# Patient Record
Sex: Male | Born: 1965 | Race: White | Hispanic: No | Marital: Married | Smoking: Never smoker
Health system: Southern US, Community
[De-identification: ages and names within clinical notes are randomized; demographics above are authoritative.]

## PROBLEM LIST (undated history)

## (undated) DIAGNOSIS — A4902 Methicillin resistant Staphylococcus aureus infection, unspecified site: Secondary | ICD-10-CM

## (undated) DIAGNOSIS — M549 Dorsalgia, unspecified: Secondary | ICD-10-CM

## (undated) DIAGNOSIS — I10 Essential (primary) hypertension: Secondary | ICD-10-CM

## (undated) DIAGNOSIS — G8929 Other chronic pain: Secondary | ICD-10-CM

## (undated) DIAGNOSIS — E785 Hyperlipidemia, unspecified: Secondary | ICD-10-CM

## (undated) HISTORY — DX: Essential (primary) hypertension: I10

## (undated) HISTORY — DX: Hyperlipidemia, unspecified: E78.5

---

## 1987-07-26 HISTORY — PX: EYE SURGERY: SHX253

## 2009-09-03 ENCOUNTER — Observation Stay (HOSPITAL_COMMUNITY): Admission: EM | Admit: 2009-09-03 | Discharge: 2009-09-04 | Payer: Self-pay | Admitting: Emergency Medicine

## 2009-09-03 LAB — CONVERTED CEMR LAB
Basophils Absolute: 0 10*3/uL
Basophils Relative: 0 %
Chloride: 101 meq/L
Creatinine, Ser: 1.01 mg/dL
MCHC: 35.6 g/dL
Monocytes Absolute: 0.7 10*3/uL
Neutro Abs: 7.8 10*3/uL
Neutrophils Relative %: 78 %
Potassium: 3.5 meq/L
RDW: 13.4 %

## 2009-09-15 ENCOUNTER — Ambulatory Visit: Payer: Self-pay | Admitting: Nurse Practitioner

## 2009-09-15 DIAGNOSIS — R7309 Other abnormal glucose: Secondary | ICD-10-CM

## 2009-09-15 DIAGNOSIS — I10 Essential (primary) hypertension: Secondary | ICD-10-CM | POA: Insufficient documentation

## 2009-09-15 DIAGNOSIS — A4902 Methicillin resistant Staphylococcus aureus infection, unspecified site: Secondary | ICD-10-CM | POA: Insufficient documentation

## 2009-09-15 DIAGNOSIS — F341 Dysthymic disorder: Secondary | ICD-10-CM

## 2009-09-15 LAB — CONVERTED CEMR LAB
Albumin: 4.9 g/dL (ref 3.5–5.2)
Basophils Relative: 1 % (ref 0–1)
Blood Glucose, Fingerstick: 94
CO2: 27 meq/L (ref 19–32)
Calcium: 9.4 mg/dL (ref 8.4–10.5)
Chloride: 103 meq/L (ref 96–112)
Glucose, Bld: 76 mg/dL (ref 70–99)
Hemoglobin: 15.1 g/dL (ref 13.0–17.0)
Lymphs Abs: 1.4 10*3/uL (ref 0.7–4.0)
MCHC: 34.7 g/dL (ref 30.0–36.0)
Monocytes Absolute: 0.7 10*3/uL (ref 0.1–1.0)
Monocytes Relative: 12 % (ref 3–12)
Neutro Abs: 3.4 10*3/uL (ref 1.7–7.7)
Potassium: 4.2 meq/L (ref 3.5–5.3)
RBC: 5.14 M/uL (ref 4.22–5.81)
Sodium: 141 meq/L (ref 135–145)
TSH: 1.355 microintl units/mL (ref 0.350–4.500)
Total Bilirubin: 0.6 mg/dL (ref 0.3–1.2)
Total Protein: 7.8 g/dL (ref 6.0–8.3)

## 2009-09-16 ENCOUNTER — Encounter (INDEPENDENT_AMBULATORY_CARE_PROVIDER_SITE_OTHER): Payer: Self-pay | Admitting: Nurse Practitioner

## 2009-09-22 DIAGNOSIS — S62609A Fracture of unspecified phalanx of unspecified finger, initial encounter for closed fracture: Secondary | ICD-10-CM

## 2009-10-12 ENCOUNTER — Telehealth (INDEPENDENT_AMBULATORY_CARE_PROVIDER_SITE_OTHER): Payer: Self-pay | Admitting: Nurse Practitioner

## 2009-11-09 ENCOUNTER — Telehealth (INDEPENDENT_AMBULATORY_CARE_PROVIDER_SITE_OTHER): Payer: Self-pay | Admitting: Nurse Practitioner

## 2009-11-11 ENCOUNTER — Telehealth (INDEPENDENT_AMBULATORY_CARE_PROVIDER_SITE_OTHER): Payer: Self-pay | Admitting: Nurse Practitioner

## 2009-11-27 ENCOUNTER — Ambulatory Visit: Payer: Self-pay | Admitting: Nurse Practitioner

## 2009-11-27 LAB — CONVERTED CEMR LAB
Glucose, Urine, Semiquant: NEGATIVE
Nitrite: NEGATIVE
Specific Gravity, Urine: 1.02
WBC Urine, dipstick: NEGATIVE
pH: 7.5

## 2009-11-30 ENCOUNTER — Encounter (INDEPENDENT_AMBULATORY_CARE_PROVIDER_SITE_OTHER): Payer: Self-pay | Admitting: Nurse Practitioner

## 2009-11-30 LAB — CONVERTED CEMR LAB
Cholesterol: 294 mg/dL — ABNORMAL HIGH (ref 0–200)
Microalb, Ur: 0.82 mg/dL (ref 0.00–1.89)

## 2009-12-25 ENCOUNTER — Telehealth (INDEPENDENT_AMBULATORY_CARE_PROVIDER_SITE_OTHER): Payer: Self-pay | Admitting: Nurse Practitioner

## 2009-12-29 ENCOUNTER — Ambulatory Visit: Payer: Self-pay | Admitting: Nurse Practitioner

## 2009-12-29 DIAGNOSIS — IMO0002 Reserved for concepts with insufficient information to code with codable children: Secondary | ICD-10-CM | POA: Insufficient documentation

## 2010-01-26 ENCOUNTER — Ambulatory Visit: Payer: Self-pay | Admitting: Nurse Practitioner

## 2010-01-27 ENCOUNTER — Encounter (INDEPENDENT_AMBULATORY_CARE_PROVIDER_SITE_OTHER): Payer: Self-pay | Admitting: Nurse Practitioner

## 2010-01-27 LAB — CONVERTED CEMR LAB
ALT: 26 units/L (ref 0–53)
AST: 22 units/L (ref 0–37)
Cholesterol: 280 mg/dL — ABNORMAL HIGH (ref 0–200)
Total CHOL/HDL Ratio: 5.4
Total Protein: 7.7 g/dL (ref 6.0–8.3)
Triglycerides: 420 mg/dL — ABNORMAL HIGH (ref <150)

## 2010-02-24 ENCOUNTER — Telehealth (INDEPENDENT_AMBULATORY_CARE_PROVIDER_SITE_OTHER): Payer: Self-pay | Admitting: Nurse Practitioner

## 2010-02-26 ENCOUNTER — Telehealth (INDEPENDENT_AMBULATORY_CARE_PROVIDER_SITE_OTHER): Payer: Self-pay | Admitting: Nurse Practitioner

## 2010-03-25 ENCOUNTER — Ambulatory Visit: Payer: Self-pay | Admitting: Nurse Practitioner

## 2010-03-25 ENCOUNTER — Telehealth (INDEPENDENT_AMBULATORY_CARE_PROVIDER_SITE_OTHER): Payer: Self-pay | Admitting: Nurse Practitioner

## 2010-03-26 ENCOUNTER — Encounter (INDEPENDENT_AMBULATORY_CARE_PROVIDER_SITE_OTHER): Payer: Self-pay | Admitting: Nurse Practitioner

## 2010-03-26 LAB — CONVERTED CEMR LAB
Cholesterol: 241 mg/dL — ABNORMAL HIGH (ref 0–200)
Triglycerides: 421 mg/dL — ABNORMAL HIGH (ref <150)

## 2010-04-23 ENCOUNTER — Telehealth (INDEPENDENT_AMBULATORY_CARE_PROVIDER_SITE_OTHER): Payer: Self-pay | Admitting: Nurse Practitioner

## 2010-05-12 ENCOUNTER — Telehealth (INDEPENDENT_AMBULATORY_CARE_PROVIDER_SITE_OTHER): Payer: Self-pay | Admitting: Nurse Practitioner

## 2010-05-12 ENCOUNTER — Ambulatory Visit: Payer: Self-pay | Admitting: Nurse Practitioner

## 2010-05-13 ENCOUNTER — Telehealth (INDEPENDENT_AMBULATORY_CARE_PROVIDER_SITE_OTHER): Payer: Self-pay | Admitting: *Deleted

## 2010-06-15 ENCOUNTER — Ambulatory Visit: Payer: Self-pay | Admitting: Nurse Practitioner

## 2010-06-15 DIAGNOSIS — E669 Obesity, unspecified: Secondary | ICD-10-CM

## 2010-06-15 DIAGNOSIS — E7800 Pure hypercholesterolemia, unspecified: Secondary | ICD-10-CM

## 2010-06-15 LAB — CONVERTED CEMR LAB
HDL goal, serum: 40 mg/dL
LDL Goal: 130 mg/dL

## 2010-06-16 ENCOUNTER — Encounter (INDEPENDENT_AMBULATORY_CARE_PROVIDER_SITE_OTHER): Payer: Self-pay | Admitting: Nurse Practitioner

## 2010-06-16 LAB — CONVERTED CEMR LAB
ALT: 21 units/L (ref 0–53)
AST: 21 units/L (ref 0–37)
CO2: 24 meq/L (ref 19–32)
Cholesterol: 259 mg/dL — ABNORMAL HIGH (ref 0–200)
Creatinine, Ser: 0.91 mg/dL (ref 0.40–1.50)
Sodium: 139 meq/L (ref 135–145)
Total Bilirubin: 0.5 mg/dL (ref 0.3–1.2)
Total CHOL/HDL Ratio: 5.9
Total Protein: 7.6 g/dL (ref 6.0–8.3)

## 2010-06-22 ENCOUNTER — Encounter (INDEPENDENT_AMBULATORY_CARE_PROVIDER_SITE_OTHER): Payer: Self-pay | Admitting: Nurse Practitioner

## 2010-06-24 ENCOUNTER — Telehealth (INDEPENDENT_AMBULATORY_CARE_PROVIDER_SITE_OTHER): Payer: Self-pay | Admitting: Nurse Practitioner

## 2010-06-24 ENCOUNTER — Ambulatory Visit (HOSPITAL_COMMUNITY)
Admission: RE | Admit: 2010-06-24 | Discharge: 2010-06-24 | Payer: Self-pay | Source: Home / Self Care | Admitting: Internal Medicine

## 2010-06-24 DIAGNOSIS — M549 Dorsalgia, unspecified: Secondary | ICD-10-CM | POA: Insufficient documentation

## 2010-07-05 ENCOUNTER — Encounter (INDEPENDENT_AMBULATORY_CARE_PROVIDER_SITE_OTHER): Payer: Self-pay | Admitting: *Deleted

## 2010-07-07 ENCOUNTER — Encounter
Admission: RE | Admit: 2010-07-07 | Discharge: 2010-07-07 | Payer: Self-pay | Source: Home / Self Care | Attending: Internal Medicine | Admitting: Internal Medicine

## 2010-07-07 ENCOUNTER — Telehealth (INDEPENDENT_AMBULATORY_CARE_PROVIDER_SITE_OTHER): Payer: Self-pay | Admitting: Nurse Practitioner

## 2010-07-09 ENCOUNTER — Encounter (INDEPENDENT_AMBULATORY_CARE_PROVIDER_SITE_OTHER): Payer: Self-pay | Admitting: Nurse Practitioner

## 2010-07-20 ENCOUNTER — Encounter
Admission: RE | Admit: 2010-07-20 | Discharge: 2010-07-20 | Payer: Self-pay | Source: Home / Self Care | Attending: Internal Medicine | Admitting: Internal Medicine

## 2010-07-29 ENCOUNTER — Telehealth (INDEPENDENT_AMBULATORY_CARE_PROVIDER_SITE_OTHER): Payer: Self-pay | Admitting: *Deleted

## 2010-08-02 ENCOUNTER — Encounter (INDEPENDENT_AMBULATORY_CARE_PROVIDER_SITE_OTHER): Payer: Self-pay | Admitting: Nurse Practitioner

## 2010-08-06 ENCOUNTER — Encounter (INDEPENDENT_AMBULATORY_CARE_PROVIDER_SITE_OTHER): Payer: Self-pay | Admitting: Nurse Practitioner

## 2010-08-10 ENCOUNTER — Telehealth (INDEPENDENT_AMBULATORY_CARE_PROVIDER_SITE_OTHER): Payer: Self-pay | Admitting: Nurse Practitioner

## 2010-08-17 ENCOUNTER — Encounter (INDEPENDENT_AMBULATORY_CARE_PROVIDER_SITE_OTHER): Payer: Self-pay | Admitting: Nurse Practitioner

## 2010-08-24 ENCOUNTER — Telehealth (INDEPENDENT_AMBULATORY_CARE_PROVIDER_SITE_OTHER): Payer: Self-pay | Admitting: Nurse Practitioner

## 2010-08-26 NOTE — Letter (Signed)
Summary: PT INFORMATION SHEET  PT INFORMATION SHEET   Imported By: [name] 11/04/2009 12:07:08  _____________________________________________________________________  External Attachment:    Type:   Image     Comment:   External Document

## 2010-08-26 NOTE — Progress Notes (Signed)
Summary: MRI results  Phone Note Outgoing Call   Summary of Call: notify pt that his MRI does show that he has a great deal of arthritis along with some irritation in the nerves in the lower part of his back will refer him to neurosurgery for further evaluation received refill request from tramadol. he got 120 on 06/15/2010 - will not refill he should be alternating tramadol with diclofenac (rx given 03/2010 with 3 refills) Will also start gabapentin 300mg  by mouth night x 3 nights then increase to 2 capsules by mouth nightly for pain (he should consider getting this from [facility] or [place] pharmacy as it will be expensive at [facility]) Can see if he can be seen by interventional radiology in the meantime for a steroid injection in his back which may offer some relief.  If pt wants this referral as well let me know and i can order it Initial call taken by: Lehman Prom [name],  June 24, 2010 6:47 PM  Follow-up for Phone Call        left message on machine for pt to return call to the office. Shawn Pittman  June 25, 2010 4:23 PM  pt informed of above information and will try the gabapentin to see if it will help.  he is still debating about the injection in his back at this time.  Follow-up by: Shawn Pittman,  June 25, 2010 4:35 PM  Additional Follow-up for Phone Call Additional follow up Details #1::        MR Cowing CALLED THIS MORNING AND SAYS THAT HE WANTS [place] TO GO AHEAD AND MAKE HIS REFERRAL TO SEE SOMEONE TO GET THE SHOTS IN HIS BACK Additional Follow-up by: Leodis Rains,  June 28, 2010 1:02 PM  New Problems: BACK PAIN (ICD-724.5)   Additional Follow-up for Phone Call Additional follow up Details #2::    order for interventional radiology done will have Arna Medici schedule  also will have pt referred to neurosurgery for additional evaluation Follow-up by: Lehman Prom [name],  June 28, 2010 3:19 PM  Additional Follow-up for Phone Call Additional follow up Details  #3:: Details for Additional Follow-up Action Taken: I SEND THE REFERRAL ON 06-28-10 WAITING FOR AN APPT .Marland KitchenCheryll Dessert  June 30, 2010 5:04 PM   New Problems: BACK PAIN (ICD-724.5) New/Updated Medications: GABAPENTIN 300 MG CAPS (GABAPENTIN) One capsule by mouth nightly x 3 nights then 2 capsules by mouth nightly Prescriptions: GABAPENTIN 300 MG CAPS (GABAPENTIN) One capsule by mouth nightly x 3 nights then 2 capsules by mouth nightly  #60 x 1   Entered and Authorized by:   Lehman Prom [name]   Signed by:   Lehman Prom [name] on 06/24/2010   Method used:   Printed then faxed to ...       [facility]ret[facility]       [address]       [place], [place]  [address]       Ph: [phone]       Fax: [phone]   RxID:   [id number]

## 2010-08-26 NOTE — Assessment & Plan Note (Signed)
Summary: Back pain/HTN   Vital Signs:  Patient profile:   45 year old male Height:      69.50 inches Weight:      265 pounds BMI:     38.71 Temp:     97.0 degrees F oral Pulse rate:   72 / minute Pulse rhythm:   regular Resp:     22 per minute BP sitting:   130 / 84  (left arm)  Vitals Entered By: [place] Shannon (June 15, 2010 8:17 AM)  Nutrition Counseling: Patient's BMI is greater than 25 and therefore counseled on weight management options. CC: follow-up visit, Hypertension Management, Lipid Management, Depression, Back Pain Is Patient Diabetic? No Pain Assessment Patient in pain? yes     Location: lower back Intensity: 9 Type: thrombing Onset of pain  Constant  Does patient need assistance? Functional Status Self care Ambulation Normal   Primary Care Provider:  Lehman Prom [name]  CC:  follow-up visit, Hypertension Management, Lipid Management, Depression, and Back Pain.  History of Present Illness:  Pt into the office for routined f/u also with back pain. "My back went out" Pt reports that he was cutting wood on yesterday and injured his back He has a long history of back pain.  He has been taking tramadol as needed for pain but he reports that he has to take 2 tablets by mouth two times a day for pain.  Left shoulder - Nothing acute per x-ray (pt presents with film from [facility] done on last night) Lumbar spine - degenerative disc, arthritis  Chronic pain - multiple fractures to all digits. Aches and cramps in all finger. Previously worked in [employer]  Girlfriend present with pt today.   Back Pain History:      The patient's back pain has been present for > 6 weeks.  The pain is located in the lower back region and does not radiate below the knees.  He states this is not work related.  He states that he has had a prior history of back pain.  The patient has not had any recent physical therapy for his back pain.    Critical Exclusionary  Diagnosis Criteria (CEDC) for Back Pain:      There are no symptoms to suggest infection, cancer, cauda equina, or psychosocial factors for back pain.  Other positive CEDC factors include low back pain worse with activity.    Depression History:      The patient notes a diminished interest in his usual daily activities.  The patient denies recurrent thoughts of death or suicide.        Psychosocial stress factors include the recent death of a loved one.  The patient denies that he feels like life is not worth living, denies that he wishes that he were dead, and denies that he has thought about ending his life.         Depression Treatment History:  Prior Medication Used:   Start Date: Assessment of Effect:   Comments:  Zoloft (sertraline)     --     some improvement     taking medications as ordered  Hypertension History:      He denies headache, chest pain, and palpitations.  He notes no problems with any antihypertensive medication side effects.        Positive major cardiovascular risk factors include male age 55 years old or older, hyperlipidemia, and hypertension.  Negative major cardiovascular risk factors include no history of diabetes and non-tobacco-user  status.        Further assessment for target organ damage reveals no history of stroke/TIA, peripheral vascular disease, or renal insufficiency.    Lipid Management History:      Positive NCEP/ATP III risk factors include male age 41 years old or older and hypertension.  Negative NCEP/ATP III risk factors include non-diabetic, non-tobacco-user status, no prior stroke/TIA, no peripheral vascular disease, and no history of aortic aneurysm.        The patient states that he does not know about the "Therapeutic Lifestyle Change" diet.  The patient does not know about adjunctive measures for cholesterol lowering.  Comments include: pt is fasting today for labs.        Habits & Providers  Alcohol-Tobacco-Diet     Alcohol drinks/day:  <1     Alcohol type: beer     Tobacco Status: never  Exercise-Depression-Behavior     Does Patient Exercise: no     Depression Counseling: not indicated; screening negative for depression     Drug Use: past  Current Medications (verified): 1)  Sertraline Hcl 100 Mg Tabs (Sertraline Hcl) .... One Tablet By Mouth Daily For Mood 2)  Lisinopril 20 Mg Tabs (Lisinopril) .... One Tablet By Mouth Daily For Blood Pressure 3)  Hibiclens 4 % Liqd (Chlorhexidine Gluconate) .... Apply To Body and Let Lather. Let Sit For 5 Minutes Then Rinse At Least Three Times Per Week 4)  Diclofenac Sodium 75 Mg Tbec (Diclofenac Sodium) .... One Tablet By Mouth Twice Daily For Joints 5)  Ultram 50 Mg Tabs (Tramadol Hcl) .... Two Tablets By Mouth Two Times A Day As Needed For Pain 6)  Pravastatin Sodium 20 Mg Tabs (Pravastatin Sodium) .... Two Tablets By Mouth Nightly For Cholesterol 7)  Lovaza 1 Gm Caps (Omega-3-Acid Ethyl Esters) .... 2 Capsules By Mouth Two Times A Day For Cholesterol 8)  Clonazepam 1 Mg Tabs (Clonazepam) .... One Tablet By Mouth Daily As Needed For Anxiety  Allergies (verified): No Known Drug Allergies  Review of Systems General:  Denies fever. CV:  Denies chest pain or discomfort. Resp:  Denies cough. GI:  Denies abdominal pain, nausea, and vomiting. MS:  Complains of joint pain; low back pain and aches in all joints.  Physical Exam  General:  alert.   Head:  normocephalic.   Eyes:  right eye enucleation Lungs:  normal breath sounds.   Heart:  normal rate and regular rhythm.   Abdomen:  normal bowel sounds.   Neurologic:  alert & oriented X3.     Impression & Recommendations:  Problem # 1:  HYPERTENSION, BENIGN ESSENTIAL (ICD-401.1) BP is doing well continue current medications His updated medication list for this problem includes:    Lisinopril 20 Mg Tabs (Lisinopril) ..... One tablet by mouth daily for blood pressure  Problem # 2:  HYPERCHOLESTEROLEMIA (ICD-272.0) will  check labs today His updated medication list for this problem includes:    Pravastatin Sodium 20 Mg Tabs (Pravastatin sodium) .Marland Kitchen..Marland Kitchen Two tablets by mouth nightly for cholesterol    Lovaza 1 Gm Caps (Omega-3-acid ethyl esters) .Marland Kitchen... 2 capsules by mouth two times a day for cholesterol  Orders: T-Comprehensive Metabolic Panel [id number]) T-Lipid Profile ([id number])  Problem # 3:  BACK PAIN (ICD-724.5) ongoing problem with pt  will add muscle relaxer His updated medication list for this problem includes:    Diclofenac Sodium 75 Mg Tbec (Diclofenac sodium) ..... One tablet by mouth twice daily for joints    Ultram 50 Mg Tabs (  Tramadol hcl) .Marland Kitchen..Marland Kitchen Two tablets by mouth two times a day as needed for pain    Cyclobenzaprine Hcl 10 Mg Tabs (Cyclobenzaprine hcl) ..... One tablet by mouth nightly as needed for muscles  Problem # 4:  ANXIETY DEPRESSION (ICD-300.4) will increase zoloft to 200mg  by mouth daily  Problem # 5:  OBESITY (ICD-278.00) advised pt to increase ambulation  Complete Medication List: 1)  Sertraline Hcl 100 Mg Tabs (Sertraline hcl) .... Two  tablet by mouth daily for mood **note dose increase** 2)  Lisinopril 20 Mg Tabs (Lisinopril) .... One tablet by mouth daily for blood pressure 3)  Hibiclens 4 % Liqd (Chlorhexidine gluconate) .... Apply to body and let lather. let sit for 5 minutes then rinse at least three times per week 4)  Diclofenac Sodium 75 Mg Tbec (Diclofenac sodium) .... One tablet by mouth twice daily for joints 5)  Ultram 50 Mg Tabs (Tramadol hcl) .... Two tablets by mouth two times a day as needed for pain 6)  Pravastatin Sodium 20 Mg Tabs (Pravastatin sodium) .... Two tablets by mouth nightly for cholesterol 7)  Lovaza 1 Gm Caps (Omega-3-acid ethyl esters) .... 2 capsules by mouth two times a day for cholesterol 8)  Clonazepam 1 Mg Tabs (Clonazepam) .... One tablet by mouth daily as needed for anxiety 9)  Cyclobenzaprine Hcl 10 Mg Tabs (Cyclobenzaprine hcl)  .... One tablet by mouth nightly as needed for muscles  Hypertension Assessment/Plan:      The patient's hypertensive risk group is category B: At least one risk factor (excluding diabetes) with no target organ damage.  Today's blood pressure is 130/84.  His blood pressure goal is < 140/90.  Lipid Assessment/Plan:      Based on NCEP/ATP III, the patient's risk factor category is "2 or more risk factors and a calculated 10 year CAD risk of > 20%".  The patient's lipid goals are as follows: Total cholesterol goal is 200; LDL cholesterol goal is 130; HDL cholesterol goal is 40; Triglyceride goal is 150.    Patient Instructions: 1)  Sign a release to get records from [facility] - x-ray results. 2)  Your cholesterol will be checked today. You will be notified of the results. 3)  Back pain - Your tramadol will be increased to 120 per month.  Be sure to stick to this amount.  Will add flexeril (muscle relaxer) as needed  4)  Mood swings - will increase sertraline to 100mg  - 2 tablets by mouth daily.  Keep your Klonopin at 30 per month.   5)  Follow up in 3 months for blood pressure and mood. Prescriptions: SERTRALINE HCL 100 MG TABS (SERTRALINE HCL) Two  tablet by mouth daily for mood **note dose increase**  #60 x 5   Entered and Authorized by:   Lehman Prom [name]   Signed by:   Lehman Prom [name] on 06/15/2010   Method used:   Print then Give to Patient   RxID:   [id number] CYCLOBENZAPRINE HCL 10 MG TABS (CYCLOBENZAPRINE HCL) One tablet by mouth nightly as needed for muscles  #30 x 0   Entered and Authorized by:   Lehman Prom [name]   Signed by:   Lehman Prom [name] on 06/15/2010   Method used:   Print then Give to Patient   RxID:   [id number] ULTRAM 50 MG TABS (TRAMADOL HCL) Two tablets by mouth two times a day as needed for pain  #120 x 0   Entered and Authorized by:  Lehman Prom [name]   Signed by:   Lehman Prom [name] on 06/15/2010   Method used:   Print then Give  to Patient   RxID:   [id number]    Orders Added: 1)  Est. Patient Level III [[address]] 2)  T-Comprehensive Metabolic Panel [[id number]] 3)  T-Lipid Profile [[id number]]

## 2010-08-26 NOTE — Assessment & Plan Note (Signed)
Summary: HTN   Vital Signs:  Patient profile:   45 year old male Weight:      253 pounds Temp:     98.2 degrees F Pulse rate:   75 / minute Pulse rhythm:   regular Resp:     20 per minute BP sitting:   148 / 100 Cuff size:   large  Vitals Entered By: Chauncy Passy, SMA CC: Shawn Pittman. is here for a f/u from feb. on is BP and review meds. , Hypertension Management, Depression, Back Pain Is Patient Diabetic? No Pain Assessment Patient in pain? yes     Location: hands Intensity: 8 Type: aching Onset of pain  Constant  Does patient need assistance? Functional Status Self care Ambulation Normal   CC:  Shawn Pittman. is here for a f/u from feb. on is BP and review meds. , Hypertension Management, Depression, and Back Pain.  History of Present Illness:  Shawn Pittman into the office for f/u. He has been seen once to establish care in February 2011.  Shawn Pittman presents today with his medication [facility].  Pain - mostly in his hands and also in his back. He has started the arthrotec but reports that he did not get any during March. He was just able to get a supply in April but has not seen any benefits. Tramadol - finds effective but he does admit that he takes 2 twice a day. 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 "I don't want to get on any narcotic"  Back Pain History:      The patient's back pain has been present for > 6 weeks.  The pain is located in the lower back region and does not radiate below the knees.    Depression History:      The patient denies insomnia, fatigue (loss of energy), and recurrent thoughts of death or suicide.        The patient denies that he feels like life is not worth living, denies that he wishes that he were dead, and denies that he has thought about ending his life.         Depression Treatment History:  Prior Medication Used:   Start Date: Assessment of Effect:   Comments:  Zoloft  (sertraline)     --     some improvement     will increase the dose  Hypertension History:      Positive major cardiovascular risk factors include male age 28 years old or older and hypertension.  Negative major cardiovascular risk factors include no history of diabetes and non-tobacco-user status.        Further assessment for target organ damage reveals no history of stroke/TIA or renal insufficiency.       Habits & Providers  Alcohol-Tobacco-Diet     Alcohol drinks/day: <1     Alcohol type: beer     Tobacco Status: never  Exercise-Depression-Behavior     Does Patient Exercise: no     Have you felt down or hopeless? yes     Have you felt little pleasure in things? yes     Depression Counseling: not indicated; screening negative for depression     Drug Use: past  Comments: PHQ-9 score = 3 (Shawn Pittman has already started meds)  Current Medications (verified): 1)  Sertraline Hcl 50 Mg Tabs (Sertraline Hcl) .... One Tablet By Mouth Nightly For Mood 2)  Lisinopril 10 Mg Tabs (  Lisinopril) .... One Tablet By Mouth For Blood Pressure 3)  Betasept Surgical Scrub 4 % Liqd (Chlorhexidine Gluconate) .... Use To Affected Area Twice Weekly To Body 4)  Arthrotec 75 75-200 Mg-Mcg Tabs (Diclofenac-Misoprostol) .... One Tablet By Mouth Two Times A Day For Joints **take With Food** 5)  Ultram 50 Mg Tabs (Tramadol Hcl) .... Two Tablets By Mouth Daily As Needed For Pain  Allergies (verified): No Known Drug Allergies  Review of Systems CV:  Denies chest pain or discomfort. Resp:  Denies cough. GI:  Denies abdominal pain. MS:  Complains of joint pain and low back pain. Psych:  Complains of depression; improving with zoloft.  Physical Exam  General:  alert.  obese Head:  normocephalic.   Eyes:  right eye - enucleated Lungs:  normal breath sounds.   Heart:  normal rate and regular rhythm.   Neurologic:  alert & oriented X3.   Skin:  color normal.   Psych:  Oriented X3.     Impression &  Recommendations:  Problem # 1:  HYPERTENSION, BENIGN ESSENTIAL (ICD-401.1) BP is still elevated will increase lisinopril to 20mg  by mouth daily reinforced DASH diet His updated medication list for this problem includes:    Lisinopril 20 Mg Tabs (Lisinopril) ..... One tablet by mouth daily for blood pressure  Orders: T-Urine Microalbumin w/creat. ratio [phone]) T-Lipid Profile ([id number]) UA Dipstick w/o Micro (manual) (44010)  Problem # 2:  ANXIETY DEPRESSION (ICD-300.4) Shawn Pittman is doing well on zoloft but will increase to 100mg  for added benefits  Problem # 3:  BACK PAIN (ICD-724.5) Will increase tramadol to up to three per day no NARCS His updated medication list for this problem includes:    Diclofenac Sodium 75 Mg Tbec (Diclofenac sodium) ..... 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 One tablet by mouth daily for mood 2)  Lisinopril 20 Mg Tabs (Lisinopril) .... One tablet by mouth daily for blood pressure 3)  Betasept Surgical Scrub 4 % Liqd (Chlorhexidine gluconate) .... Use to affected area twice weekly to body 4)  Diclofenac Sodium 75 Mg Tbec (Diclofenac sodium) .... One tablet by mouth twice daily for joints 5)  Ultram 50 Mg Tabs (Tramadol hcl) .... Two tablets by mouth two times a day as needed for pain  Hypertension Assessment/Plan:      The patient's hypertensive risk group is category B: At least one risk factor (excluding diabetes) with no target organ damage.  Today's blood pressure is 148/100.  His blood pressure goal is < 140/90.  Patient Instructions: 1)  Blood pressure - Still a little high. Will increase lisinopril to  20mg  by mouth daily. You can take 2 of the 10mg  tablets that you have then start the new prescription for 20mg   2)  Mood - will increase zoloft to 100mg  by mouth daily 3)  Joints. - ok to take up to 3 tramadol per day. 4)  Take the diclofenac 75mg  by mouth two times a day for joints (with food) 5)  Follow up in 1 month for a blood pressure check - nurse visit 6)  Take blood pressure medications before this visit  7)  Follow up with n.martin,fnp in 4 months (September)  or sooner if necessary.   8)  Have pharmacy send refill requests to this office for any refills that you need before your next visit Prescriptions: SERTRALINE HCL 100 MG TABS (SERTRALINE HCL) One tablet by mouth daily for mood  #30 x 3   Entered and Authorized by:   Lehman Prom [name]   Signed by:   Lehman Prom [name] on 11/27/2009   Method used:   Print then Give to Patient   RxID:   [id number] ULTRAM 50 MG TABS (TRAMADOL HCL) Two tablets by mouth two times a day as needed for pain  #90 x 0   Entered and Authorized by:   Lehman Prom [name]   Signed by:   Lehman Prom [name] on 11/27/2009   Method used:   Print then Give to Patient   RxID:   [id number] DICLOFENAC SODIUM 75 MG TBEC (DICLOFENAC SODIUM) One tablet by mouth twice daily for joints  #60 x 1   Entered and Authorized by:   Lehman Prom [name]   Signed by:   Lehman Prom [name] on 11/27/2009   Method used:   Print then Give to Patient   RxID:   [id number] LISINOPRIL 20 MG TABS (LISINOPRIL) One tablet by mouth daily for blood pressure  #30 x 3   Entered and Authorized by:   Lehman Prom [name]   Signed by:   Lehman Prom [name] on 11/27/2009   Method used:   Print then Give to Patient   RxID:   [id number]   Laboratory Results   Urine Tests  Date/Time Received: Nov 27, 2009 8:42 AM   Routine Urinalysis   Color: yellow Glucose: negative   (Normal Range: Negative) Bilirubin: negative   (Normal Range: Negative) Ketone: negative    (Normal Range: Negative) Spec. Gravity: 1.020   (Normal Range: 1.003-1.035) Blood: negative   (Normal Range: Negative) pH: 7.5   (Normal Range: 5.0-8.0) Protein: negative   (Normal Range: Negative) Urobilinogen: 0.2   (Normal Range: 0-1) Nitrite: negative   (Normal Range: Negative) Leukocyte Esterace: negative   (Normal Range: Negative)

## 2010-08-26 NOTE — Letter (Signed)
Summary: REQUESTING RECORDS FROM [facility]  REQUESTING RECORDS FROM [facility]   Imported By: [name] 06/22/2010 15:03:43  _____________________________________________________________________  External Attachment:    Type:   Image     Comment:   External Document

## 2010-08-26 NOTE — Letter (Signed)
Summary: Lipid Letter  [facility]  [address] [place], [place] [address]   Phone: [phone]  Fax: [phone]    03/26/2010  Markez Dowland [address] [place], [place]  [address]  Dear [name]:  We have carefully reviewed your last lipid profile from 03/25/2010 and the results are noted below with a summary of recommendations for lipid management.    Cholesterol:       241     Goal: less than 200   HDL "good" Cholesterol:   47     Goal: greater than 40   LDL "bad" Cholesterol:  Too High to calculate   Triglycerides:       421     Goal: less than 150    Labs done during recent office visit shows that your cholesterol is still very high. You should have been contacted by this office about medication changes.    Current Medications: 1)    Sertraline Hcl 100 Mg Tabs (Sertraline hcl) .... One tablet by mouth daily for mood 2)    Lisinopril 20 Mg Tabs (Lisinopril) .... One tablet by mouth daily for blood pressure 3)    Hibiclens 4 % Liqd (Chlorhexidine gluconate) .... Apply to body and let lather. let sit for 5 minutes then rinse at least three times per week 4)    Diclofenac Sodium 75 Mg Tbec (Diclofenac sodium) .... One tablet by mouth twice daily for joints 5)    Ultram 50 Mg Tabs (Tramadol hcl) .... Two tablets by mouth two times a day as needed for pain 6)    Pravastatin Sodium 20 Mg Tabs (Pravastatin sodium) .... Two tablets by mouth nightly for cholesterol 7)    Lovaza 1 Gm Caps (Omega-3-acid ethyl esters) .... 2 capsules by mouth two times a day for cholesterol  If you have any questions, please call. We appreciate being able to work with you.   Sincerely,    [facility] Lehman Prom [name]

## 2010-08-26 NOTE — Letter (Signed)
Summary: Lipid Letter  [facility]  [address] [place], [place] [address]   Phone: [phone]  Fax: [phone]    11/30/2009  Shawn Pittman [address] [place], [place]  [address]  Dear Shawn Pittman:  We have carefully reviewed your last lipid profile from 11/27/2009 and the results are noted below with a summary of recommendations for lipid management.    Cholesterol:       294     Goal: less than 200   HDL "good" Cholesterol:   38     Goal: greater than 40   LDL "bad" Cholesterol:  Too High to Calculate   Triglycerides:       466     Goal: Less than 150    You should have been notified by this office regarding your cholesterol.  See the above values.  Start medications as ordered and also lifestyle modifications.     Current Medications: 1)    Sertraline Hcl 100 Mg Tabs (Sertraline hcl) .... One tablet by mouth daily for mood 2)    Lisinopril 20 Mg Tabs (Lisinopril) .... One tablet by mouth daily for blood pressure 3)    Betasept Surgical Scrub 4 % Liqd (Chlorhexidine gluconate) .... Use to affected area twice weekly to body 4)    Diclofenac Sodium 75 Mg Tbec (Diclofenac sodium) .... One tablet by mouth twice daily for joints 5)    Ultram 50 Mg Tabs (Tramadol hcl) .... Two tablets by mouth two times a day as needed for pain 6)    Pravastatin Sodium 20 Mg Tabs (Pravastatin sodium) .... One tablet by mouth nightly for cholesterol  If you have any questions, please call. We appreciate being able to work with you.   Sincerely,    Lehman Prom, [name] [facility]

## 2010-08-26 NOTE — Letter (Signed)
Summary: TEST ORDER FORM//MRI//APPT DATE & TIME  TEST ORDER FORM//MRI//APPT DATE & TIME   Imported By: [name] 06/22/2010 15:50:50  _____________________________________________________________________  External Attachment:    Type:   Image     Comment:   External Document

## 2010-08-26 NOTE — Progress Notes (Signed)
Summary: Office Visit//DEPRESSION SCREENING  Office Visit//DEPRESSION SCREENING   Imported By: [name] 01/22/2010 11:47:02  _____________________________________________________________________  External Attachment:    Type:   Image     Comment:   External Document

## 2010-08-26 NOTE — Progress Notes (Signed)
Summary: REFILL ON TRAMADOL  Phone Note Call from Patient Call back at Home Phone [phone]   Reason for Call: Refill Medication Summary of Call: MARTIN PT. MR Ledo IS CALLING IN FOR A REFILL REQUEST FOR HIS TRAMADOL TO BE CALLLED INTO [facility]. Initial call taken by: Leodis Rains,  November 09, 2009 11:55 AM  Follow-up for Phone Call        last filled 10/12/09 #60 forward to N. Daphine Deutscher, [name] Follow-up by: Levon Hedger,  November 09, 2009 12:11 PM  Additional Follow-up for Phone Call Additional follow up Details #1::        notify pt will fill meds on 11/12/2009 and fax to [facility] Meds are for 1 month supply Additional Follow-up by: Lehman Prom [name],  November 09, 2009 12:37 PM    Additional Follow-up for Phone Call Additional follow up Details #2::    pt informed. Follow-up by: Levon Hedger,  November 09, 2009 1:07 PM

## 2010-08-26 NOTE — Letter (Signed)
Summary: Lipid Letter  [facility]  [address] [place], [place] [address]   Phone: [phone]  Fax: [phone]    01/27/2010  Shawn Pittman [address] [place], [place]  [address]  Dear Shawn Pittman:  We have carefully reviewed your last lipid profile from 01/26/2010 and the results are noted below with a summary of recommendations for lipid management.    Cholesterol:       280     Goal: less than 200   HDL "good" Cholesterol:   52     Goal: greater than 40   LDL "bad" Cholesterol:  TOO HIGH TO CALCULATE   Triglycerides:       420     Goal: LESS THAN 150    Your cholesterol is still VERY elevated.  You should have spoken with this office about the need to increase your cholesterol medication.  It is very important that you stop eating fried and fatty foods.  Start walking to help lower your weight.  Cholesterol is very important as this is what causes heart attacks and stroke.     Current Medications: 1)    Sertraline Hcl 100 Mg Tabs (Sertraline hcl) .... One tablet by mouth daily for mood 2)    Lisinopril 20 Mg Tabs (Lisinopril) .... One tablet by mouth daily for blood pressure 3)    Hibiclens 4 % Liqd (Chlorhexidine gluconate) .... Apply to body and let lather. let sit for 5 minutes then rinse at least three times per week 4)    Diclofenac Sodium 75 Mg Tbec (Diclofenac sodium) .... One tablet by mouth twice daily for joints 5)    Ultram 50 Mg Tabs (Tramadol hcl) .... Two tablets by mouth two times a day as needed for pain 6)    Pravastatin Sodium 20 Mg Tabs (Pravastatin sodium) .... Two tablets by mouth nightly for cholesterol  If you have any questions, please call. We appreciate being able to work with you.   Sincerely,    [facility] Lehman Prom [name]

## 2010-08-26 NOTE — Letter (Signed)
Summary: Handout Printed  Printed Handout:  - Diet - Low-Fat, Low-Saturated-Fat, Low-Cholesterol Diets 

## 2010-08-26 NOTE — Letter (Signed)
Summary: Lipid Letter  [facility]  [address] [place], [place] [address]   Phone: [phone]  Fax: [phone]    06/16/2010  Shawn Pittman [address] [place], [place]  [address]  Dear [name]:  We have carefully reviewed your last lipid profile from 06/15/2010 and the results are noted below with a summary of recommendations for lipid management.    Cholesterol:      259    Goal: less than 200   HDL "good" Cholesterol:   44     Goal: greater than 40   LDL "bad" Cholesterol:  TOO HIGH CALCULATE   Triglycerides:      590    Goal: LESS THAN 150    Your cholesterol is still elevated.  You should continue current medications.  However most importantly you need to change your diet.  You need to eat baked instead of fried foods. Also make low fat food selections.  You will cholesterol rechecked in 3 months.     Current Medications: 1)    Sertraline Hcl 100 Mg Tabs (Sertraline hcl) .... Two  tablet by mouth daily for mood **note dose increase** 2)    Lisinopril 20 Mg Tabs (Lisinopril) .... One tablet by mouth daily for blood pressure 3)    Hibiclens 4 % Liqd (Chlorhexidine gluconate) .... Apply to body and let lather. let sit for 5 minutes then rinse at least three times per week 4)    Diclofenac Sodium 75 Mg Tbec (Diclofenac sodium) .... One tablet by mouth twice daily for joints 5)    Ultram 50 Mg Tabs (Tramadol hcl) .... Two tablets by mouth two times a day as needed for pain 6)    Pravastatin Sodium 20 Mg Tabs (Pravastatin sodium) .... Two tablets by mouth nightly for cholesterol 7)    Lovaza 1 Gm Caps (Omega-3-acid ethyl esters) .... 2 capsules by mouth two times a day for cholesterol 8)    Clonazepam 1 Mg Tabs (Clonazepam) .... One tablet by mouth daily as needed for anxiety 9)    Cyclobenzaprine Hcl 10 Mg Tabs (Cyclobenzaprine hcl) .... One tablet by mouth nightly as needed for muscles  If you have any questions, please call. We  appreciate being able to work with you.   Sincerely,    [facility] Lehman Prom [name]

## 2010-08-26 NOTE — Letter (Signed)
Summary: REQUESTED RECORDS FOR SELF  REQUESTED RECORDS FOR SELF   Imported By: [name] 07/09/2010 11:12:46  _____________________________________________________________________  External Attachment:    Type:   Image     Comment:   External Document

## 2010-08-26 NOTE — Letter (Signed)
Summary: Handout Printed  Printed Handout:  - Diet - Low-Cholesterol Guidelines 

## 2010-08-26 NOTE — Letter (Signed)
Summary: *Referral Letter  [facility]  [address] [place], [place] [address]   Phone: [phone]  Fax: [phone]    08/06/2010  Shawn Pittman [address] [place], [place]  [address]  Phone: [phone]  To: Shawn Pittman. Shawn Pittman,  Shawn Pittman is an established patient in this office.  See below his current medical history. Of particular concern is his back pain.  Shawn Pittman has begun the work up process to determine the cause of his low back pain.  He has had both and x-ray and MRI.  He had an interlaminar epidural steroid injection in attempt to provide some pain relief.  He is currently being managed with pain medications and he has been advised to make some lifestyle changes to limit excessive bending and lifting. He is scheduled to see a neurosurgeon on September 28, 2010 for additional work up and treatment plans.  The specialist will determine the course of action for the patient and if he needs surgical intervention.  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 let sit for 5 minutes then rinse at least three times per week 4)  DICLOFENAC SODIUM 75 MG TBEC (DICLOFENAC SODIUM) One tablet by mouth twice daily for joints 5)  ULTRAM  50 MG TABS (TRAMADOL HCL) Two tablets by mouth two times a day as needed for pain 6)  PRAVASTATIN SODIUM 20 MG TABS (PRAVASTATIN SODIUM) Two tablets by mouth nightly for cholesterol 7)  LOVAZA 1 GM CAPS (OMEGA-3-ACID ETHYL ESTERS) 2 capsules by mouth two times a day for cholesterol 8)  CLONAZEPAM 1 MG TABS (CLONAZEPAM) One tablet by mouth daily as needed for anxiety 9)  CYCLOBENZAPRINE HCL 10 MG TABS (CYCLOBENZAPRINE HCL) One tablet by mouth nightly as needed for muscles 10)  GABAPENTIN 300 MG CAPS (GABAPENTIN) One capsule by mouth nightly x 3 nights then 2 capsules by mouth nightly      Please contact us if you have any further questions or need additional information.  Sincerely,  Lehman Prom [name]

## 2010-08-26 NOTE — Progress Notes (Signed)
Summary: medication refill  Phone Note Refill Request Message from:  Patient on February 26, 2010 4:48 PM  Refills Requested: Medication #1:  ULTRAM 50 MG TABS Two tablets by mouth two times a day as needed for pain   Last Refilled: 01/26/2010 Initial call taken by: Levon Hedger,  February 26, 2010 4:49 PM    Prescriptions: ULTRAM 50 MG TABS (TRAMADOL HCL) Two tablets by mouth two times a day as needed for pain  #90 x 0   Entered and Authorized by:   Lehman Prom [name]   Signed by:   Lehman Prom [name] on 02/26/2010   Method used:   Historical   RxID:   [id number]

## 2010-08-26 NOTE — Progress Notes (Signed)
Summary: refills on klonopin  Phone Note Call from Patient   Summary of Call: pt called to let Marria Mathison know he went to have injection on his back.  He is asking for refill on his Klonopin it is due on 12/17. Initial call taken by: Levon Hedger,  July 07, 2010 3:15 PM  Follow-up for Phone Call        will refill on 07/09/2010 (Friday) Follow-up by: Lehman Prom [name],  July 07, 2010 3:22 PM  Additional Follow-up for Phone Call Additional follow up Details #1::        rx done and will be [facility] [place] notify pt to check there for availability Additional Follow-up by: Lehman Prom [name],  July 09, 2010 8:02 AM    Additional Follow-up for Phone Call Additional follow up Details #2::    pt in offce today and informed of above information. Follow-up by: Levon Hedger,  July 09, 2010 12:54 PM

## 2010-08-26 NOTE — Letter (Signed)
Summary: MAILED REQUESTED RECORDS TO [facility]  MAILED REQUESTED RECORDS TO [facility]   Imported By: [name] 08/17/2010 15:21:02  _____________________________________________________________________  External Attachment:    Type:   Image     Comment:   External Document

## 2010-08-26 NOTE — Progress Notes (Signed)
Summary: Tramadol Rx  Phone Note Call from Patient Call back at [facility] Phone [phone]   Summary of Call: The pt still waiting from his tramadol medication to be fax to [facility].   Please call him back. [facility] [name] Initial call taken by: Manon Hilding,  November 11, 2009 2:51 PM  Follow-up for Phone Call        Rx if for a month supply of meds His medications were filled on 10/22/2009 and will not be due until 11/12/2009 no matter if he requests early it will still not be sent to the pharmacy until the month has lapsed. New Rx will be sent to [facility] on Thursday adn he can check there to find out when it will be ready Follow-up by: Lehman Prom [name],  November 11, 2009 2:56 PM  Additional Follow-up for Phone Call Additional follow up Details #1::        called [phone] tried to convey message to pt his phone went out . Levon Hedger  November 11, 2009 4:57 PM  Per Selena Batten pt informed of above information.  Additional Follow-up by: Levon Hedger,  November 12, 2009 9:25 AM    Prescriptions: ULTRAM 50 MG TABS (TRAMADOL HCL) Two tablets by mouth daily as needed for pain  #60 x 0   Entered and Authorized by:   Lehman Prom [name]   Signed by:   Lehman Prom [name] on 11/11/2009   Method used:   Printed then faxed to ...       [facility] (retail)       [address] [place], [place]  [address]       Ph: [phone]       Fax: [phone]   RxID:   [id number]

## 2010-08-26 NOTE — Progress Notes (Signed)
Summary: Requesting for increase the quantity of the Tramadol Med  Phone Note Call from Patient Call back at [phone]   Call For: [phone] Summary of Call: Ms. Clydie Braun who is the girlfriend of the pt was calling on behalf of the pt above.  According to her, the pt is wondered if the provider can increase the dose or the quantity from his tramadol medication  because there are some times that  he takes 3 to 4 pills per day depending of the level of pan.  Pt is suffering with back pain.  [facility] [address]).  Please call him back if that could be possible. [facility] [name] Initial call taken by: Manon Hilding,  February 24, 2010 9:34 AM  Follow-up for Phone Call        forward to N. Daphine Deutscher, fnp last filled 01/26/2010 #90 Follow-up by: Levon Hedger,  February 24, 2010 10:32 AM  Additional Follow-up for Phone Call Additional follow up Details #1::        Pt is getting tramadol 90 per month will not increase the dose he should also be taking an anti-inflammatory(diclofenac)  in between the tramadol  Additional Follow-up by: Lehman Prom [name],  February 24, 2010 10:55 AM    Additional Follow-up for Phone Call Additional follow up Details #2::    pt informed of above information. Follow-up by: Levon Hedger,  February 24, 2010 12:30 PM

## 2010-08-26 NOTE — Letter (Signed)
Summary: *HSN Results Follow up  [facility]  [address] [place], [place] [address]   Phone: [phone]  Fax: [phone]      09/16/2009   Shawn Pittman [address] [place], [place]  [address]   Dear  Mr. [name],                            ____S.Drinkard,[name]   ____D. Gore,[name]       ____B. McPherson,MD   ____V. Rankins,MD    ____E. Mulberry,MD    _X___N. Daphine Deutscher, [name]  ____D. Reche Dixon, MD    ____K. Philipp Deputy, MD    ____Other     This letter is to inform you that your recent test(s):  _______Pap Smear    ___X____Lab Test     _______X-ray    ___X____ is within acceptable limits  _______ requires a medication change  _______ requires a follow-up lab visit  _______ requires a follow-up visit with your provider   Comments: Labs done during recent office visit are normal.       _________________________________________________________ If you have any questions, please contact our office [phone].                    Sincerely,    Lehman Prom [name] [facility]

## 2010-08-26 NOTE — Miscellaneous (Signed)
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

## 2010-08-26 NOTE — Assessment & Plan Note (Signed)
Summary: Cellulitis - Left Arm   Vital Signs:  Patient profile:   45 year old male Weight:      253 pounds BMI:     36.96 Temp:     97.9 degrees F oral Pulse rate:   67 / minute Pulse rhythm:   regular Resp:     16 per minute BP sitting:   136 / 91  (right arm) Cuff size:   large CC: redness and swelling to upper left arm Is Patient Diabetic? No Pain Assessment Patient in pain? yes     Location: arm Intensity: 7-8 Type: throbbing Onset of pain  Intermittent, started getting bad yesterday and today  Does patient need assistance? Functional Status Self care Ambulation Normal Comments Upper left arm with significant swelling and redness; states started as "a pimple" about 4-5 days ago, then progressed to current appearance.  States has history of staph infections.   Primary Care Provider:  Lehman Prom [name]  CC:  redness and swelling to upper left arm.  History of Present Illness:  States history of staph infections. Area of concern started about 3 days ago. Started as a small pimple but then increased in size. Previous history of Staph so severe such that she had to be hospitalized.   Allergies: No Known Drug Allergies  Review of Systems General:  Complains of malaise and sleep disorder; denies fatigue, fever, loss of appetite, weakness, and weight loss. Derm:  Complains of changes in color of skin and lesion(s); left upper arm has large amount of redness and swelling surrounding a small scabbed area on lateral posterior arm.  Also has several open and scabbed areas to entire left ar and lower left leg.  States lower leg wounds are due to poison oak.  Healing area to right lateral leg, states it was due to a staph infection.  Pt. states small amount of yellowish-green drainage  came from upper arm scabbed area.  .  Physical Exam  General:  alert.   Eyes:  right eye - enucleated Skin:  left upper arm - tender, erythematous - approx 2cm x 4cm nonflutuant  arms  with multple crusted lesions Psych:  Oriented X3.     Impression & Recommendations:  Problem # 1:  CELLULITIS, ARM (ICD-682.3) not ideal for I& D at this time will treat with antibiotic bactroban samples given with applicators to use to appl warm compresses to affected area *pt did not get betasept  His updated medication list for this problem includes:    Bactrim Ds 800-160 Mg Tabs (Sulfamethoxazole-trimethoprim) ..... One tablet by mouth two times a day for infection  Complete Medication List: 1)  Sertraline Hcl 100 Mg Tabs (Sertraline hcl) .... One tablet by mouth daily for mood 2)  Lisinopril 20 Mg Tabs (Lisinopril) .... One tablet by mouth daily for blood pressure 3)  Betasept Surgical Scrub 4 % Liqd (Chlorhexidine gluconate) .... Use to affected area twice weekly to body 4)  Diclofenac Sodium 75 Mg Tbec (Diclofenac sodium) .... One tablet by mouth twice daily for joints 5)  Ultram 50 Mg Tabs (Tramadol hcl) .... Two tablets by mouth two times a day as needed for pain 6)  Pravastatin Sodium 20 Mg Tabs (Pravastatin sodium) .... One tablet by mouth nightly for cholesterol 7)  Bactrim Ds 800-160 Mg Tabs (Sulfamethoxazole-trimethoprim) .... One tablet by mouth two times a day for infection  Patient Instructions: 1)  Take antibiotics as ordered two times a day for 10 days. 2)  Warm compresses  to left arm every 2 hours 3)  This office will [facility] if [facility] has bet[facility]pt and will call you back  4)  May apply bactroban ointment to affected areas two times a day Prescriptions: BACTRIM DS 800-160 MG TABS (SULFAMETHOXAZOLE-TRIMETHOPRIM) One tablet by mouth two times a day for infection  #20 x 0   Entered by:   Dutch Quint RN   Authorized by:   Lehman Prom [name]   Signed by:   Dutch Quint RN on 12/29/2009   Method used:   Electronically to        [facility]       [address]       [place], [place]  [address]       Ph: [phone]        Fax: [phone]   RxID:   [id number]   Appended Document: Cellulitis - Left Arm    Clinical Lists Changes  Medications: Changed medication from BETASEPT SURGICAL SCRUB 4 % LIQD (CHLORHEXIDINE GLUCONATE) use to affected area twice weekly to body to HIBICLENS 4 % LIQD (CHLORHEXIDINE GLUCONATE) Apply to body and let lather. let sit for 5 minutes then rinse at least three times per week - Signed Rx of HIBICLENS 4 % LIQD (CHLORHEXIDINE GLUCONATE) Apply to body and let lather. let sit for 5 minutes then rinse at least three times per week;  #275ml x 0;  Signed;  Entered by: Lehman Prom [name];  Authorized by: Lehman Prom [name];  Method used: Faxed to [facility], [address] [place], [place]  [address], Ph: [phone], Fax: [phone]    Prescriptions: HIBICLENS 4 % LIQD (CHLORHEXIDINE GLUCONATE) Apply to body and let lather. let sit for 5 minutes then rinse at least three times per week  #245ml x 0   Entered and Authorized by:   Lehman Prom [name]   Signed by:   Lehman Prom [name] on 12/30/2009   Method used:   Faxed to ...       [facility] (retail)       [address] [place], [place]  [address]       Ph: [phone]       Fax: [phone]   RxID:   [id number]

## 2010-08-26 NOTE — Progress Notes (Signed)
Summary: medication refill  Phone Note Refill Request   Refills Requested: Medication #1:  ULTRAM 50 MG TABS Two tablets by mouth two times a day as needed for pain   Last Refilled: 07/14/2010  Medication #2:  CLONAZEPAM 1 MG TABS One tablet by mouth daily as needed for anxiety   Last Refilled: 07/09/2010 rx needs to be faxed to [facility] in [place].  Initial call taken by: Levon Hedger,  August 10, 2010 12:04 PM Initial call taken by: Levon Hedger,  August 10, 2010 12:04 PM  Follow-up for Phone Call        will refill clonzepam tramadol not due until 08/14/2010 so will fill at the end of the week and fax to pharmacy Follow-up by: Lehman Prom [name],  August 10, 2010 1:39 PM  Additional Follow-up for Phone Call Additional follow up Details #1::        pt aware of above information. Additional Follow-up by: Levon Hedger,  August 10, 2010 3:58 PM    Prescriptions: CLONAZEPAM 1 MG TABS (CLONAZEPAM) One tablet by mouth daily as needed for anxiety  #30 x 0   Entered and Authorized by:   Lehman Prom [name]   Signed by:   Lehman Prom [name] on 08/10/2010   Method used:   Printed then faxed to ...       [facility] [address] (retail)       [address]       [place], [place]  [address]       Ph: [phone]       Fax: [phone]   RxID:   [id number]

## 2010-08-26 NOTE — Progress Notes (Signed)
Summary: REMINDER TO REFILL TRAMADOL  Phone Note Call from Patient Call back at Home Phone [phone]   Reason for Call: Refill Medication Summary of Call: Shawn Pittman PT. Shawn Pittman IS CALLING ABOUT HIS TRAMADOL THAT IS DUE MONDAY THE 6th.  HE USES [facility] ON [address]. Initial call taken by: Leodis Rains,  December 25, 2009 3:45 PM  Follow-up for Phone Call        Will forward to N. Daphine Deutscher for authorization. Last filled 11/27/09 #90 no refills. Gaylyn Cheers RN  December 25, 2009 3:57 PM   Additional Follow-up for Phone Call Additional follow up Details #1::        Rx printed - Shiela to fax back notify pt to check at [facility] Additional Follow-up by: Lehman Prom [name],  December 28, 2009 7:49 AM    Additional Follow-up for Phone Call Additional follow up Details #2::    Pt. notified had already gotten script earlier today. Follow-up by: Gaylyn Cheers RN,  December 28, 2009 3:12 PM  Prescriptions: ULTRAM 50 MG TABS (TRAMADOL HCL) Two tablets by mouth two times a day as needed for pain  #90 x 0   Entered and Authorized by:   Lehman Prom [name]   Signed by:   Lehman Prom [name] on 12/28/2009   Method used:   Printed then faxed to ...       [facility]       [address]       [place], [place]  [address]       Ph: [phone]       Fax: [phone]   RxID:   [id number]

## 2010-08-26 NOTE — Progress Notes (Signed)
Summary: HAD APPT 10/13/09/NO ORANGE CARD//NEEDS MEDS TILL APPT FOR ELIGIB  Phone Note Call from Patient   Reason for Call: Refill Medication Summary of Call: HAD APPT MARCH 30 TO GET ORANGE CARD,[facility] CANCELED RES. FOR SOMETIME IN APRIL//  THEN TRANSFERRED TO SUPERIVISOR///NEVER GOT THE APPT.NEEDS ALL MEDS.HAD APPT FOR MARCH 22ND,BUT DOESN'T HAVE ORANGE CARD HAS TRIED SEVERAL TIMES TO  DO WALK IN  //[facility]  [phone].HE IS OUT OF HIS MEDS//DON'T KNOW WHEN HE CAN GET CARD/ HAS TRANSPORTION PROBLEMS.WHAT TO KNOW IF YOU CAN IM [facility] ,TILL HE GETS HIS ORANGE CARD//PLEASE CALL HIM BACK LET HIM KNOW IF HE CAN GET THEM Initial call taken by: [name],  October 12, 2009 12:03 PM  Follow-up for Phone Call        forward to N. Daphine Deutscher, [name] Follow-up by: Levon Hedger,  October 12, 2009 12:15 PM  Additional Follow-up for Phone Call Additional follow up Details #1::        Rx in basket - fax to [facility]. Advise pt he will need to get meds from there and perhaps they will discount. He can resume getting meds from [facility] pharmacy when he gets an orange card. Additional Follow-up by: Lehman Prom [name],  October 12, 2009 12:36 PM    Additional Follow-up for Phone Call Additional follow up Details #2::    pt informed and will try to get meds from there  Follow-up by: Levon Hedger,  October 12, 2009 5:24 PM  Prescriptions: BETASEPT SURGICAL SCRUB 4 % LIQD (CHLORHEXIDINE GLUCONATE) use to affected area twice weekly to body  #276ml x 1   Entered and Authorized by:   Lehman Prom [name]   Signed by:   Lehman Prom [name] on 10/12/2009   Method used:   Printed then faxed to ...       [facility] (retail)       [address] [place], [place]  [address]       Ph: [phone]       Fax: [phone]   RxID:   [id number] ARTHROTEC 75 75-200 MG-MCG TABS (DICLOFENAC-MISOPROSTOL) One tablet by mouth two times a day for joints **take with food**   #60 x 1   Entered and Authorized by:   Lehman Prom [name]   Signed by:   Lehman Prom [name] on 10/12/2009   Method used:   Printed then faxed to ...       [facility] Department (retail)       [address] [place], [place]  [address]       Ph: [phone]       Fax: [phone]   RxID:   [id number] ULTRAM 50 MG TABS (TRAMADOL HCL) Two tablets by mouth daily as needed for pain  #60 x 0   Entered and Authorized by:   Lehman Prom [name]   Signed by:   Lehman Prom [name] on 10/12/2009   Method used:   Printed then faxed to ...       [facility] Department (retail)       [address] [place], [place]  [address]       Ph: [phone]       Fax: [phone]   RxID:   [id number] LISINOPRIL 10 MG TABS (LISINOPRIL) One tablet by mouth for blood pressure  #30 x 1   Entered and  Authorized by:   Lehman Prom [name]   Signed by:   Lehman Prom [name] on 10/12/2009   Method used:   Printed then faxed to ...       [facility] Department (retail)       [address] [place], [place]  [address]       Ph: [phone]       Fax: [phone]   RxID:   [id number] SERTRALINE HCL 50 MG TABS (SERTRALINE HCL) One tablet by mouth nightly for mood  #30 x 1   Entered and Authorized by:   Lehman Prom [name]   Signed by:   Lehman Prom [name] on 10/12/2009   Method used:   Printed then faxed to ...       [facility] Department (retail)       [address] [place], [place]  [address]       Ph: [phone]       Fax: [phone]   RxID:   [id number]

## 2010-08-26 NOTE — Progress Notes (Signed)
Summary: Med Refills  Phone Note Call from Patient   Summary of Call: pt requesting refills on lisinopril,diclofenac,pravastain,tramadol [facility] also pt is requesting increase on tramadol to #120. pt states he takes meds 2 two times a day and current quantity is not enough. advised pt this should be discussed at a follow up appt Initial call taken by: Michelle Nasuti,  March 25, 2010 8:58 AM  Follow-up for Phone Call        see previous phone note - will not increase dose of tramadol.  he should be alternating with [facility]ofenac [facility] Follow-up by: Lehman Prom [name],  March 25, 2010 9:38 AM  Additional Follow-up for Phone Call Additional follow up Details #1::        Advised pt. of provider's response, iinstructions and refilled Rxs-verbalized understanding.  Dutch Quint RN  March 25, 2010 10:00 AM     Prescriptions: DICLOFENAC SODIUM 75 MG TBEC (DICLOFENAC SODIUM) One tablet by mouth twice daily for joints  #60 x 3   Entered and Authorized by:   Lehman Prom [name]   Signed by:   Lehman Prom [name] on 03/25/2010   Method used:   Electronically to        [facility] (retail)       [address]       [place], [place]  [address]       Ph: [phone]       Fax: [phone]   RxID:   [id number] ULTRAM 50 MG TABS (TRAMADOL HCL) Two tablets by mouth two times a day as needed for pain  #90 x 0   Entered and Authorized by:   Lehman Prom [name]   Signed by:   Lehman Prom [name] on 03/25/2010   Method used:   Electronically to        [facility] ([facility]ail)       [address]       [place], [place]  [address]       Ph: [phone]       Fax: [phone]   RxID:   [id number] LISINOPRIL 20 MG TABS (LISINOPRIL) One tablet by mouth daily for blood pressure  #30 x 3   Entered and Authorized by:   Lehman Prom [name]   Signed by:    Lehman Prom [name] on 03/25/2010   Method used:   Electronically to        [facility] (retail)       [address]       [place], [place]  [address]       Ph: [phone]       Fax: [phone]   RxID:   [id number]
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
## 2010-08-26 NOTE — Letter (Signed)
Summary: *HSN Results Follow up  [facility]  [address] [place], [place] [address]   Phone: [phone]  Fax: [phone]      07/05/2010   [name] [address] [place], [place]  [address]   Dear  Mr. Lazlo Mccaskill, [facility] IS BEEN TRYING TO GET IN CONTACT WITH YOU TO SCHEDULE AND APPT FOR YOU LUMBAR INJECTIONS . IF YOU CAN PLEASE CALL  [phone] AND SCHEDULE AN APPT  THANK YOU .                             _________________________________________________________ If you have any questions, please contact our office                     Sincerely,  Cheryll Dessert [facility]
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
## 2010-08-26 NOTE — Progress Notes (Signed)
Summary: REFILL TRAMADOL  Phone Note Call from Patient Call back at Home Phone [phone]   Reason for Call: Refill Medication Summary of Call: Shawn Pittman CALLED IN FOR HIS TRAMADOL TO BE SENT TO [facility] IN [facility]. [phone] Initial call taken by: Leodis Rains,  April 23, 2010 12:54 PM  Follow-up for Phone Call        Sent to N. Daphine Deutscher.  Dutch Quint RN  April 23, 2010 2:33 PM   Additional Follow-up for Phone Call Additional follow up Details #1::        Rx printed  in basket - fax back to pharmacy  notify pt Additional Follow-up by: Lehman Prom [name],  April 23, 2010 2:36 PM    Additional Follow-up for Phone Call Additional follow up Details #2::    Rx faxed, pt. notified.  Dutch Quint RN  April 23, 2010 2:56 PM   Prescriptions: ULTRAM 50 MG TABS (TRAMADOL HCL) Two tablets by mouth two times a day as needed for pain  #90 x 0   Entered and Authorized by:   Lehman Prom [name]   Signed by:   Lehman Prom [name] on 04/23/2010   Method used:   Printed then faxed to ...       [facility] [address] (retail)       [address]       [place], [place]  [address]       Ph: [phone]       Fax: [phone]   RxID:   [id number]

## 2010-08-26 NOTE — Letter (Signed)
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

## 2010-08-26 NOTE — Progress Notes (Signed)
Summary: refill on pain medication  Phone Note Call from Patient   Summary of Call: spoke with pt and he said that his son is flying out to go to his sons graduation in the service and he will be gone for a week and a half he is leaving next Tuesday.  please contact pt at [phone]. Initial call taken by: Levon Hedger,  May 13, 2010 12:58 PM  Follow-up for Phone Call        PT CALLED NEEDS TRAMADO CALLED IN TODAY/HE IS LEAVING WED FOR 2 WEEKS TO GO TO [place].ALL OF HIS OTHER REILLS ARE OK BUT THIS WILL REUN OUT//PHONE [phone] Follow-up by: [name],  May 17, 2010 12:46 PM  Additional Follow-up for Phone Call Additional follow up Details #1::        Will give Rx to the pt which is not due until 05/23/2010 (rx in basket - pt can come pick up) however his NEXT Rx will not be due until June 23, 2010. He will not get November Rx early. Additional Follow-up by: Lehman Prom [name],  May 17, 2010 2:01 PM    Additional Follow-up for Phone Call Additional follow up Details #2::    Please, send the prescription to [facility]   [facility]. 1 [phone] Thank you  Follow-up by: Cheryll Dessert,  May 18, 2010 8:50 AM  Additional Follow-up for Phone Call Additional follow up Details #3:: Details for Additional Follow-up Action Taken: FAXED Additional Follow-up by: [name],  May 18, 2010 2:26 PM  New/Updated Medications: ULTRAM 50 MG TABS (TRAMADOL HCL) Two tablets by mouth two times a day as needed for pain Prescriptions: ULTRAM 50 MG TABS (TRAMADOL HCL) Two tablets by mouth two times a day as needed for pain  #90 x 0   Entered and Authorized by:   Lehman Prom [name]   Signed by:   Lehman Prom [name] on 05/17/2010   Method used:   Print then Give to Patient   RxID:   [id number]
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
## 2010-08-26 NOTE — Assessment & Plan Note (Signed)
Summary: Anxiety   Vital Signs:  Patient profile:   45 year old male Weight:      260.2 pounds BMI:     38.01 Temp:     97.3 degrees F oral Pulse rate:   80 / minute Pulse rhythm:   regular Resp:     20 per minute BP sitting:   150 / 110  (left arm) Cuff size:   large  Vitals Entered By: Levon Hedger (May 12, 2010 12:33 PM)  Nutrition Counseling: Patient's BMI is greater than 25 and therefore counseled on weight management options. CC: pt is taking Zoloft for mood. he has had some very stressful issues in his life over the last month and nothing is helping and he is  not behaving well or handling any issue well.  , Depression, Hypertension Management Is Patient Diabetic? No Pain Assessment Patient in pain? no       Does patient need assistance? Functional Status Self care Ambulation Normal   Primary Care Provider:  Lehman Prom [name]  CC:  pt is taking Zoloft for mood. he has had some very stressful issues in his life over the last month and nothing is helping and he is  not behaving well or handling any issue well.  , Depression, and Hypertension Management.  History of Present Illness:  Pt into the office with concerns about his mood and anxiety.  Depression History:      Positive alarm features for depression include insomnia.  However, he denies recurrent thoughts of death or suicide.        Psychosocial stress factors include the recent death of a loved one and major life changes.  The patient denies that he feels like life is not worth living, denies that he wishes that he were dead, and denies that he has thought about ending his life.        Comments:  Pt was tolerating the zoloft daily but with the recent loss of his grandfather.  Depression Treatment History:  Prior Medication Used:   Start Date: Assessment of Effect:   Comments:  Zoloft (sertraline)     --     some improvement     will increase the dose  Hypertension History:      He denies  headache, chest pain, and palpitations.  Pt is still taking his blood pressure medications .        Positive major cardiovascular risk factors include male age 35 years old or older and hypertension.  Negative major cardiovascular risk factors include no history of diabetes and non-tobacco-user status.        Further assessment for target organ damage reveals no history of stroke/TIA or renal insufficiency.      Habits & Providers  Alcohol-Tobacco-Diet     Alcohol drinks/day: <1     Alcohol type: beer     Tobacco Status: never  Exercise-Depression-Behavior     Does Patient Exercise: no     Depression Counseling: not indicated; screening negative for depression     Drug Use: past  Allergies (verified): No Known Drug Allergies  Review of Systems CV:  Denies chest pain or discomfort. Resp:  Denies cough. GI:  Denies abdominal pain, nausea, and vomiting. Neuro:  Denies headaches. Psych:  Complains of anxiety and easily angered.  Physical Exam  General:  alert.   Head:  normocephalic.   Eyes:  right eye enucleated Lungs:  normal breath sounds.   Heart:  normal rate and regular rhythm.  Abdomen:  normal bowel sounds.   Msk:  normal ROM.   Neurologic:  alert & oriented X3.   Skin:  multiple crusted area to arms Psych:  Oriented X3.     Impression & Recommendations:  Problem # 1:  ANXIETY DEPRESSION (ICD-300.4) advised pt about dx will add clonazepam will continue zoloft  Problem # 2:  HYPERTENSION, BENIGN ESSENTIAL (ICD-401.1) Bp elevated today but may be due to stress will recheck on next visit  if still elevated will need to increase meds His updated medication list for this problem includes:    Lisinopril 20 Mg Tabs (Lisinopril) ..... One tablet by mouth daily for blood pressure  Problem # 3:  NEED PROPHYLACTIC VACCINATION&INOCULATION FLU (ICD-V04.81) given today in office  Complete Medication List: 1)  Sertraline Hcl 100 Mg Tabs (Sertraline hcl) .... One  tablet by mouth daily for mood 2)  Lisinopril 20 Mg Tabs (Lisinopril) .... One tablet by mouth daily for blood pressure 3)  Hibiclens 4 % Liqd (Chlorhexidine gluconate) .... Apply to body and let lather. let sit for 5 minutes then rinse at least three times per week 4)  Diclofenac Sodium 75 Mg Tbec (Diclofenac sodium) .... One tablet by mouth twice daily for joints 5)  Ultram 50 Mg Tabs (Tramadol hcl) .... Two tablets by mouth two times a day as needed for pain 6)  Pravastatin Sodium 20 Mg Tabs (Pravastatin sodium) .... Two tablets by mouth nightly for cholesterol 7)  Lovaza 1 Gm Caps (Omega-3-acid ethyl esters) .... 2 capsules by mouth two times a day for cholesterol 8)  Clonazepam 1 Mg Tabs (Clonazepam) .... One tablet by mouth daily as needed for anxiety  Other Orders: Flu Vaccine 15yrs + (16109) Admin 1st Vaccine (60454)  Hypertension Assessment/Plan:      The patient's hypertensive risk group is category B: At least one risk factor (excluding diabetes) with no target organ damage.  Today's blood pressure is 150/110.  His blood pressure goal is < 140/90.  Patient Instructions: 1)  Continue to take zoloft daily. 2)  Add clonazepam as needed daily for nerves. 3)  This may be cheaper to get from [facility] - [address] (Prescription will be $5) 4)  You can get from [facility] if you want but it will likely be more than $5. 5)  You have received the flu vaccine today. 6)  Follow up in 1 month with n.martin,fnp for medication review and blood pressure Prescriptions: CLONAZEPAM 1 MG TABS (CLONAZEPAM) One tablet by mouth daily as needed for anxiety  #30 x 0   Entered and Authorized by:   Lehman Prom [name]   Signed by:   Lehman Prom [name] on 05/12/2010   Method used:   Print then Give to Patient   RxID:   [id number]    Orders Added: 1)  Est. Patient Level III [30865] 2)  Flu Vaccine 21yrs + [78469] 3)  Admin 1st Vaccine [62952]   Immunizations  Administered:  Influenza Vaccine # 1:    Vaccine Type: Fluvax 3+    Site: right deltoid    Mfr: Wyeth    Dose: 0.5 ml    Route: IM    Given by: Gaylyn Cheers RN    Exp. Date: 12/2010    Lot #: [id number]    VIS given: 02/16/10 version given May 12, 2010.  Flu Vaccine Consent Questions:    Do you have a history of severe allergic reactions to this vaccine? no  Any prior history of allergic reactions to egg and/or gelatin? no    Do you have a sensitivity to the preservative Thimersol? no    Do you have a past history of Guillan-Barre Syndrome? no    Do you currently have an acute febrile illness? no    Have you ever had a severe reaction to latex? no    Vaccine information given and explained to patient? yes   Immunizations Administered:  Influenza Vaccine # 1:    Vaccine Type: Fluvax 3+    Site: right deltoid    Mfr: Wyeth    Dose: 0.5 ml    Route: IM    Given by: Gaylyn Cheers RN    Exp. Date: 12/2010    Lot #: [id number]    VIS given: 02/16/10 version given May 12, 2010.

## 2010-09-01 NOTE — Progress Notes (Signed)
Summary: PRAVASTATIN  Phone Note Refill Request   Refills Requested: Medication #1:  PRAVASTATIN SODIUM 20 MG TABS Two tablets by mouth nightly for cholesterol PT NEED A REFILL PT IS OUT OF MEDS   [facility]  [phone]  Initial call taken by: Cheryll Dessert,  August 24, 2010 9:45 AM  Follow-up for Phone Call        pt would like you to contact them when you are done... pt is going out of town by 3pm and would like it by then... [phone]  please call her Follow-up by: [place] Shannon,  August 24, 2010 12:06 PM    Prescriptions: PRAVASTATIN SODIUM 20 MG TABS (PRAVASTATIN SODIUM) Two tablets by mouth nightly for cholesterol  #60 x 3   Entered by:   Dutch Quint RN   Authorized by:   Lehman Prom [name]   Signed by:   Dutch Quint RN on 08/24/2010   Method used:   Electronically to        [facility] [id number]* (retail)       [address] [place], [place]  [address]       Ph: [phone]       Fax: [phone]   RxID:   [id number]

## 2010-09-15 ENCOUNTER — Telehealth (INDEPENDENT_AMBULATORY_CARE_PROVIDER_SITE_OTHER): Payer: Self-pay | Admitting: Nurse Practitioner

## 2010-09-30 NOTE — Progress Notes (Signed)
Summary: Verify pt - No med refills  Phone Note Outgoing Call Call back at [phone]   Summary of Call: [place] in January indicated that pt was incarcerated He may have been released and if so, I will refill meds.  But if not there should not be any refil requests. Call and speak with pt DIRECTLY to verify that he has requested refills on cyclobenaprine and clonazepam Initial call taken by: Lehman Prom [name],  September 15, 2010 12:12 PM  Follow-up for Phone Call        Messages are not to be left on listed home phone #.  Left message on voicemail [phone] for pt. to return call.  Dutch Quint RN  September 16, 2010 10:14 AM  Left message on voicemail for pt. to return call.  Dutch Quint RN  September 20, 2010 4:22 PM  Male returned call and stated pt. is "out of town" for awhile -- advised that I need to speak to pt. directly.  She stated she will relay message.  Dutch Quint RN  September 20, 2010 4:54 PM   Additional Follow-up for Phone Call Additional follow up Details #1::        Will NOT refill meds until pt makes contact with this office Additional Follow-up by: Lehman Prom [name],  September 21, 2010 9:01 AM     Appended Document: Verify pt - No med refills Spoke with male in home -- pt. has been incarcerated since January 25th and will remain so "not more than 5 months and not less than 4 months."  Is currently in [facility].  Dutch Quint RN, September 21, 2010  2:25 PM

## 2010-10-14 LAB — GRAM STAIN

## 2010-10-14 LAB — BASIC METABOLIC PANEL
Calcium: 9.6 mg/dL (ref 8.4–10.5)
GFR calc Af Amer: >60 mL/min (ref >60)
GFR calc non Af Amer: >60 mL/min (ref >60)
Potassium: 3.5 mEq/L (ref 3.5–5.1)
Sodium: 138 mEq/L (ref 135–145)

## 2010-10-14 LAB — WOUND CULTURE

## 2010-10-14 LAB — DIFFERENTIAL
Basophils Absolute: 0 10*3/uL (ref 0.0–0.1)
Eosinophils Relative: 1 % (ref 0–5)
Lymphocytes Relative: 14 % (ref 12–46)
Lymphs Abs: 1.4 10*3/uL (ref 0.7–4.0)
Monocytes Absolute: 0.7 10*3/uL (ref 0.1–1.0)
Monocytes Relative: 7 % (ref 3–12)
Neutro Abs: 7.8 10*3/uL — ABNORMAL HIGH (ref 1.7–7.7)

## 2010-10-14 LAB — CBC
HCT: 44.7 % (ref 39.0–52.0)
Hemoglobin: 15.9 g/dL (ref 13.0–17.0)
RBC: 5.13 MIL/uL (ref 4.22–5.81)
RDW: 13.4 % (ref 11.5–15.5)
WBC: 10.1 10*3/uL (ref 4.0–10.5)

## 2010-12-08 ENCOUNTER — Ambulatory Visit: Payer: Self-pay | Attending: Internal Medicine | Admitting: Physical Therapy

## 2010-12-08 DIAGNOSIS — M2560 Stiffness of unspecified joint, not elsewhere classified: Secondary | ICD-10-CM | POA: Insufficient documentation

## 2010-12-08 DIAGNOSIS — M545 Low back pain, unspecified: Secondary | ICD-10-CM | POA: Insufficient documentation

## 2010-12-08 DIAGNOSIS — R262 Difficulty in walking, not elsewhere classified: Secondary | ICD-10-CM | POA: Insufficient documentation

## 2010-12-08 DIAGNOSIS — M25569 Pain in unspecified knee: Secondary | ICD-10-CM | POA: Insufficient documentation

## 2010-12-08 DIAGNOSIS — IMO0001 Reserved for inherently not codable concepts without codable children: Secondary | ICD-10-CM | POA: Insufficient documentation

## 2010-12-15 ENCOUNTER — Ambulatory Visit: Payer: Self-pay

## 2010-12-15 ENCOUNTER — Encounter: Payer: Self-pay | Admitting: Physical Therapy

## 2010-12-22 ENCOUNTER — Ambulatory Visit: Payer: Self-pay | Admitting: Physical Therapy

## 2010-12-29 ENCOUNTER — Ambulatory Visit: Payer: Self-pay | Attending: Internal Medicine | Admitting: Physical Therapy

## 2010-12-29 DIAGNOSIS — M545 Low back pain, unspecified: Secondary | ICD-10-CM | POA: Insufficient documentation

## 2010-12-29 DIAGNOSIS — IMO0001 Reserved for inherently not codable concepts without codable children: Secondary | ICD-10-CM | POA: Insufficient documentation

## 2010-12-29 DIAGNOSIS — M25569 Pain in unspecified knee: Secondary | ICD-10-CM | POA: Insufficient documentation

## 2010-12-29 DIAGNOSIS — M2560 Stiffness of unspecified joint, not elsewhere classified: Secondary | ICD-10-CM | POA: Insufficient documentation

## 2010-12-29 DIAGNOSIS — R262 Difficulty in walking, not elsewhere classified: Secondary | ICD-10-CM | POA: Insufficient documentation

## 2011-01-12 ENCOUNTER — Encounter: Payer: Self-pay | Admitting: Physical Therapy

## 2011-03-23 ENCOUNTER — Other Ambulatory Visit: Payer: Self-pay | Admitting: Internal Medicine

## 2011-03-23 DIAGNOSIS — M545 Low back pain, unspecified: Secondary | ICD-10-CM

## 2011-03-31 ENCOUNTER — Ambulatory Visit (HOSPITAL_COMMUNITY)
Admission: RE | Admit: 2011-03-31 | Discharge: 2011-03-31 | Disposition: A | Discharge status: Home / Self Care | Payer: Self-pay | Source: Ambulatory Visit | Attending: Internal Medicine | Admitting: Internal Medicine

## 2011-03-31 DIAGNOSIS — M545 Low back pain, unspecified: Secondary | ICD-10-CM

## 2011-03-31 DIAGNOSIS — M5126 Other intervertebral disc displacement, lumbar region: Secondary | ICD-10-CM | POA: Insufficient documentation

## 2011-03-31 DIAGNOSIS — M519 Unspecified thoracic, thoracolumbar and lumbosacral intervertebral disc disorder: Secondary | ICD-10-CM | POA: Insufficient documentation

## 2011-03-31 DIAGNOSIS — M79609 Pain in unspecified limb: Secondary | ICD-10-CM | POA: Insufficient documentation

## 2011-04-29 ENCOUNTER — Encounter: Payer: Self-pay | Attending: Physical Medicine & Rehabilitation

## 2011-04-29 ENCOUNTER — Ambulatory Visit: Payer: Self-pay | Admitting: Physical Medicine & Rehabilitation

## 2011-04-29 DIAGNOSIS — M545 Low back pain, unspecified: Secondary | ICD-10-CM | POA: Insufficient documentation

## 2011-04-29 DIAGNOSIS — M5137 Other intervertebral disc degeneration, lumbosacral region: Secondary | ICD-10-CM | POA: Insufficient documentation

## 2011-04-29 DIAGNOSIS — R209 Unspecified disturbances of skin sensation: Secondary | ICD-10-CM | POA: Insufficient documentation

## 2011-04-29 DIAGNOSIS — M51379 Other intervertebral disc degeneration, lumbosacral region without mention of lumbar back pain or lower extremity pain: Secondary | ICD-10-CM | POA: Insufficient documentation

## 2011-04-29 DIAGNOSIS — M79609 Pain in unspecified limb: Secondary | ICD-10-CM | POA: Insufficient documentation

## 2011-04-29 DIAGNOSIS — M62838 Other muscle spasm: Secondary | ICD-10-CM | POA: Insufficient documentation

## 2011-04-29 NOTE — Progress Notes (Signed)
REASON FOR REFERRAL:  Degenerative disk lumbar spine.  CHIEF COMPLAINT:  Back pain radiating into the lower extremities down to the feet.  HISTORY:  A 45 year old male who gives a 15-year history of chronic low back pain.  He has had no discrete injury.  He has had chiropractic treatment at [facility].  In addition, he reports going through physical therapy.  I do not have these notes.  He has had two lumbar epidural injections performed at [facility] in December of 2011, he underwent  L4-5 interlaminar epidural steroid injection and a left L5 transforaminal on July 20, 2010.  He has had MRI which I reviewed the report as well as the actual films.  He has mild disk bulges at L2- 3, L3-4, L4-5.  He has mild facet degenerative changes at L4-5, L5-S1. He has no evidence of a compressive lesion.  His review of systems, numbness, tingling, trouble walking, spasms, depression, anxiety, constipation.  SOCIAL HISTORY:  He lives alone, single.  States that his roommates use drugs, but he does not.  He denies any alcohol use other than a couple beers a week.  PHYSICAL EXAMINATION:  VITAL SIGNS:  His blood pressure is 121/60, pulse 64, respirations 16, and O2 sat 97% on room air. MUSCULOSKELETAL:  His back has no tenderness to palpation.  Hips have no tenderness to palpation.  He has some back pain with a right hip internal-external rotation with full range of motion.  Left hip has full range of motion.  Knee ankles have full range of motion.  Deep tendon reflexes are normal, bilateral lower extremities.  Sensation normal bilateral lower extremities to sharp and proprioception.  Straight leg raising test is negative.  Gait is small step length.  Moves slowly.  No evidence of toe drag or knee instability.  Lumbar range of motion is about 25% forward flexion/extension, lateral rotation, and bending, the patient has been too moving out in any direction.  IMPRESSION:  Lumbar  pain, chronic.  His MRI is unimpressive.  Certainly, there is nothing to explain his lower extremity symptoms on his MRI.  He has no bowel or bladder dysfunction other than some constipation.  We discussed other potential etiologies of his lower extremity pain including neuropathy.  He states he is not a diabetic.  He states that he does not drink much alcohol, although I do see primary care physician indicates that he has positive EtOH.  I discussed that this may be a potential reason, however he does not think so.  I discussed my treatment recommendations.  I really do not think any further epidurals are really needed.  I am not convinced that medial branch blocks would be of any benefit either because his pain is really omni-dimensional.  In terms of his lower extremity symptomatology, he really has no other physical exam findings.  I did recommend some physical therapy to teach him lumbar stabilization stretching program.  He states he does not want to pursue this treatment option.  I do not think narcotic analgesics are indicated in this situation, given the minimal findings on his MRI.     Erick Colace, M.D. Electronically Signed    [facility] D:  04/29/2011 13:08:15  T:  04/29/2011 20:11:30  Job #:  [id number]  cc:   Tresa Endo L. Philipp Deputy, M.D. Fax: [phone]

## 2011-06-27 ENCOUNTER — Emergency Department (HOSPITAL_COMMUNITY)
Admission: EM | Admit: 2011-06-27 | Discharge: 2011-06-27 | Disposition: A | Discharge status: Home / Self Care | Payer: Self-pay | Attending: Emergency Medicine | Admitting: Emergency Medicine

## 2011-06-27 DIAGNOSIS — M79609 Pain in unspecified limb: Secondary | ICD-10-CM | POA: Insufficient documentation

## 2011-06-27 DIAGNOSIS — R21 Rash and other nonspecific skin eruption: Secondary | ICD-10-CM | POA: Insufficient documentation

## 2011-06-27 DIAGNOSIS — IMO0002 Reserved for concepts with insufficient information to code with codable children: Secondary | ICD-10-CM | POA: Insufficient documentation

## 2011-06-27 HISTORY — DX: Other chronic pain: G89.29

## 2011-06-27 HISTORY — DX: Methicillin resistant Staphylococcus aureus infection, unspecified site: A49.02

## 2011-06-27 HISTORY — DX: Dorsalgia, unspecified: M54.9

## 2011-06-27 MED ORDER — MORPHINE SULFATE 4 MG/ML IJ SOLN
4.0000 mg | Freq: Once | INTRAMUSCULAR | Status: AC
  Administered 2011-06-27: 4 mg via INTRAVENOUS
  Filled 2011-06-27: qty 1

## 2011-06-27 MED ORDER — VANCOMYCIN HCL IN DEXTROSE 1-5 GM/200ML-% IV SOLN
1000.0000 mg | Freq: Once | INTRAVENOUS | Status: AC
  Administered 2011-06-27: 1000 mg via INTRAVENOUS
  Filled 2011-06-27: qty 200

## 2011-06-27 MED ORDER — HYDROCODONE-ACETAMINOPHEN 5-325 MG PO TABS: 1.0000 | ORAL_TABLET | ORAL | Status: AC | PRN

## 2011-06-27 MED ORDER — SODIUM CHLORIDE 0.9 % IV BOLUS (SEPSIS)
1000.0000 mL | Freq: Once | INTRAVENOUS | Status: AC
  Administered 2011-06-27: 1000 mL via INTRAVENOUS

## 2011-06-27 MED ORDER — ONDANSETRON HCL 4 MG/2ML IJ SOLN
4.0000 mg | Freq: Once | INTRAMUSCULAR | Status: AC
  Administered 2011-06-27: 4 mg via INTRAVENOUS
  Filled 2011-06-27: qty 2

## 2011-06-27 MED ORDER — DOXYCYCLINE HYCLATE 100 MG PO CAPS: 100.0000 mg | ORAL_CAPSULE | Freq: Two times a day (BID) | ORAL | Status: AC

## 2011-06-27 NOTE — ED Notes (Signed)
Patient presents with open sores to bilateral arms with "throbbing, burning" pain to exterior.  Patient has a history of MRSA and reports he has been hospitalized for same.

## 2011-06-27 NOTE — ED Notes (Signed)
Dinner ordered for patient. Pt states he has felt feverish in the last 24 hours. Has a long history of abcesses and mrsa infections. States has 1 area of painful redness and swelling to left forearm . States he did mash "alot of stuff out of it" yesterday. He does have multiple scabbed areas to both forearms and hands. When questioned pt about the "fresh picked appearance" of the lesions . States he may have scratched them because of nerves but they do not itch

## 2011-06-27 NOTE — ED Provider Notes (Signed)
History     CSN: [id number] Arrival date & time: 06/27/2011  2:00 PM   First MD Initiated Contact with Patient 06/27/11 1837      Chief Complaint  Patient presents with  . Arm Pain    MRSA/Sores    (Consider location/radiation/quality/duration/timing/severity/associated sxs/prior treatment) Patient is a 45 y.o. male presenting with rash. The history is provided by the patient.  Rash  This is a new problem. The current episode started more than 2 days ago. The problem has not changed since onset.Associated with: sheetrock exposure. There has been no fever. The rash is present on the left arm and right arm. The pain is moderate. The pain has been intermittent since onset. Associated symptoms include pain. Pertinent negatives include no blisters, no itching and no weeping. He has tried nothing for the symptoms.  Pt has hx of MRSA; presents with multiple sores to b/l forearms. States he has been picking at the areas. States he has had sores on his arms similar to this several times in the past; they have had to be drained several times. He works in [employer] and states that these areas seem to come up more when he is exposed to sheetrock at work. Denies fever, malaise at home. Has not noted any redness or streaking around the areas.  Past Medical History  Diagnosis Date  . MRSA (methicillin resistant Staphylococcus aureus)   . Back pain, chronic     Past Surgical History  Procedure Date  . Eye surgery     History reviewed. No pertinent family history.  History  Substance Use Topics  . Smoking status: Former Games developer  . Smokeless tobacco: Not on file  . Alcohol Use: Yes      Review of Systems  Constitutional: Negative for fever and chills.  Gastrointestinal: Negative for nausea and vomiting.  Musculoskeletal: Negative for myalgias.  Skin: Positive for wound. Negative for color change, itching and pallor.  Neurological: Negative for dizziness.    Allergies  Review of  patient's allergies indicates no known allergies.  Home Medications   Current Outpatient Rx  Name Route Sig Dispense Refill  . LISINOPRIL 20 MG PO TABS Oral Take 20 mg by mouth daily.      Marland Kitchen PRAVASTATIN SODIUM 40 MG PO TABS Oral Take 40 mg by mouth daily.      . SERTRALINE HCL 100 MG PO TABS Oral Take 100 mg by mouth at bedtime and may repeat dose one time if needed.        BP 165/106  Pulse 77  Temp(Src) 98.3 F (36.8 C) (Oral)  Resp 20  SpO2 98%  Physical Exam  Nursing note and vitals reviewed. Constitutional: He is oriented to person, place, and time. He appears well-developed and well-nourished. No distress.  HENT:  Head: Normocephalic and atraumatic.  Right Ear: External ear normal.  Left Ear: External ear normal.  Eyes:       R eye surgically absent  Neck: Normal range of motion.  Musculoskeletal:       Arms: Neurological: He is alert and oriented to person, place, and time.  Skin: Skin is warm and dry. He is not diaphoretic.       Many sores/wounds in differing stages of healing to b/l forearms. No drainage noted. Small area of fluctuance c/w abscess to L forearm (see diagram); measures approx 3 cm with 1 cm surrounding erythema.   Healing sore to dorsum of L hand measuring approx 2 cm; no fluctuance appreciated. No surrounding  erythema.    ED Course  Procedures (including critical care time)  INCISION AND DRAINAGE Performed by: Grant Fontana Consent: Verbal consent obtained. Risks and benefits: risks, benefits and alternatives were discussed Type: abscess  Body area: L forearm  Anesthesia: local infiltration  Local anesthetic: lidocaine 2% with epinephrine  Anesthetic total: 4 ml  Complexity: complex Blunt dissection to break up loculations  Drainage: bloody  Drainage amount: mild  Wound was not packed  Patient tolerance: Patient tolerated the procedure well with no immediate complications.     Labs Reviewed - No data to display No  results found.   1. CELLULITIS, ARM       MDM  Pt with multiple excoriations and wounds in different states of healing to the b/l forearms. States this occurs frequently when he is exposed to sheetrock; states he does pick at the areas. Denies street drug use. I+D of the L forearm was attempted which did not produce much purulent material. There is very mild surrounding cellulitis but no streaking. Pt was treated with 1 dose of vanc in the dept and given rx for doxy to take at home. Discussed s/sx that would prompt return visit, including but not limited to increased swelling, high fever, increased redness or streaking. Pt verbalized understanding and agreed to plan.        Grant Fontana, [place] 06/28/11 1248

## 2011-06-27 NOTE — ED Notes (Signed)
PA at bedside performing i&d

## 2011-06-28 NOTE — ED Provider Notes (Signed)
Medical screening examination/treatment/procedure(s) were performed by non-physician practitioner and as supervising physician I was immediately available for consultation/collaboration.   Leigh-Ann Almetta Liddicoat, MD 06/28/11 1526 

## 2011-08-13 ENCOUNTER — Emergency Department (HOSPITAL_COMMUNITY): Payer: Self-pay

## 2011-08-13 ENCOUNTER — Encounter (HOSPITAL_COMMUNITY): Payer: Self-pay

## 2011-08-13 ENCOUNTER — Emergency Department (HOSPITAL_COMMUNITY)
Admission: EM | Admit: 2011-08-13 | Discharge: 2011-08-13 | Disposition: A | Discharge status: Home / Self Care | Payer: Self-pay | Attending: Emergency Medicine | Admitting: Emergency Medicine

## 2011-08-13 DIAGNOSIS — M7918 Myalgia, other site: Secondary | ICD-10-CM

## 2011-08-13 DIAGNOSIS — IMO0001 Reserved for inherently not codable concepts without codable children: Secondary | ICD-10-CM | POA: Insufficient documentation

## 2011-08-13 DIAGNOSIS — Z79899 Other long term (current) drug therapy: Secondary | ICD-10-CM | POA: Insufficient documentation

## 2011-08-13 DIAGNOSIS — M25519 Pain in unspecified shoulder: Secondary | ICD-10-CM | POA: Insufficient documentation

## 2011-08-13 DIAGNOSIS — W19XXXA Unspecified fall, initial encounter: Secondary | ICD-10-CM

## 2011-08-13 DIAGNOSIS — M549 Dorsalgia, unspecified: Secondary | ICD-10-CM | POA: Insufficient documentation

## 2011-08-13 DIAGNOSIS — R21 Rash and other nonspecific skin eruption: Secondary | ICD-10-CM | POA: Insufficient documentation

## 2011-08-13 DIAGNOSIS — W11XXXA Fall on and from ladder, initial encounter: Secondary | ICD-10-CM | POA: Insufficient documentation

## 2011-08-13 DIAGNOSIS — M25539 Pain in unspecified wrist: Secondary | ICD-10-CM | POA: Insufficient documentation

## 2011-08-13 DIAGNOSIS — G8929 Other chronic pain: Secondary | ICD-10-CM | POA: Insufficient documentation

## 2011-08-13 DIAGNOSIS — M542 Cervicalgia: Secondary | ICD-10-CM | POA: Insufficient documentation

## 2011-08-13 MED ORDER — MORPHINE SULFATE 4 MG/ML IJ SOLN
4.0000 mg | Freq: Once | INTRAMUSCULAR | Status: DC
  Filled 2011-08-13: qty 1

## 2011-08-13 MED ORDER — HYDROMORPHONE HCL PF 1 MG/ML IJ SOLN
1.0000 mg | Freq: Once | INTRAMUSCULAR | Status: AC
  Administered 2011-08-13: 1 mg via INTRAMUSCULAR
  Filled 2011-08-13: qty 1

## 2011-08-13 MED ORDER — MORPHINE SULFATE 4 MG/ML IJ SOLN
4.0000 mg | Freq: Once | INTRAMUSCULAR | Status: AC
  Administered 2011-08-13: 4 mg via INTRAMUSCULAR

## 2011-08-13 MED ORDER — OXYCODONE-ACETAMINOPHEN 5-325 MG PO TABS: 2.0000 | ORAL_TABLET | Freq: Four times a day (QID) | ORAL | Status: AC | PRN

## 2011-08-13 MED ORDER — CYCLOBENZAPRINE HCL 10 MG PO TABS: 10.0000 mg | ORAL_TABLET | Freq: Two times a day (BID) | ORAL | Status: AC | PRN

## 2011-08-13 NOTE — ED Provider Notes (Signed)
History     CSN: [id number]  Arrival date & time 08/13/11  1411   First MD Initiated Contact with Patient 08/13/11 1535      Chief Complaint  Patient presents with  . Fall    (Consider location/radiation/quality/duration/timing/severity/associated sxs/prior treatment) HPI Patient is a 46 year old male who presents today for evaluation after falling off a ladder. At triage the patient reports that he fell off the second rung of a ladder.  In stretcher triaged the patient in stead reports that he fell 6 feet off of a ladder. He endorses right shoulder pain and is concerned that it maybe knocked out of joint. He is lying on the shoulder at that time. The patient also complains of left shoulder pain as well as neck and back pain. He also can't be sure whether he lost consciousness or not. He complains of bilateral wrist pain as well. Patient was brought in by a friend and has been ambulatory since he has been seen here. He has not taken any medication for this. Patient's pain is a 10 out of 10. It is worse with movement and palpation and better with rest. There are no other associated or modifying factors. Past Medical History  Diagnosis Date  . MRSA (methicillin resistant Staphylococcus aureus)   . Back pain, chronic     Past Surgical History  Procedure Date  . Eye surgery     History reviewed. No pertinent family history.  History  Substance Use Topics  . Smoking status: Former Games developer  . Smokeless tobacco: Not on file  . Alcohol Use: Yes      Review of Systems  Constitutional: Negative.   HENT: Negative.   Eyes: Negative.   Respiratory: Negative.   Cardiovascular: Negative.   Gastrointestinal: Negative.   Genitourinary: Negative.   Musculoskeletal:       See history of present illness  Skin: Positive for rash.       Chronic  Neurological: Negative.   Hematological: Negative.   Psychiatric/Behavioral: Negative.   All other systems reviewed and are  negative.    Allergies  Review of patient's allergies indicates no known allergies.  Home Medications   Current Outpatient Rx  Name Route Sig Dispense Refill  . LISINOPRIL 20 MG PO TABS Oral Take 20 mg by mouth daily.      Marland Kitchen PRAVASTATIN SODIUM 40 MG PO TABS Oral Take 40 mg by mouth daily.      . SERTRALINE HCL 100 MG PO TABS Oral Take 100 mg by mouth at bedtime and may repeat dose one time if needed.        BP 133/90  Pulse 70  Temp(Src) 97.8 F (36.6 C) (Oral)  Resp 18  SpO2 99%  Physical Exam  Nursing note and vitals reviewed. Constitutional: He is oriented to person, place, and time. He appears well-developed and well-nourished.  HENT:  Head: Normocephalic and atraumatic.  Eyes: Conjunctivae are normal.       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 Extraocular movements intact in the left eye  Neck: Normal range of motion.  Cardiovascular: Normal rate, regular rhythm, normal heart sounds and intact distal pulses.  Exam reveals no gallop and no friction rub.   No murmur heard. Pulmonary/Chest: Effort normal and breath sounds normal. No respiratory distress. He has no wheezes. He has no rales.  Abdominal: Soft. Bowel sounds are normal. He exhibits no distension. There is no tenderness. There is no rebound and no guarding.  Musculoskeletal: He exhibits tenderness.  Tenderness to palpation in the neck and entire back but there is no step off, bilateral wrist tenderness, right shoulder tenderness, left clavicle tenderness. Patient has history of multiple breaks here.  Neurological: He is alert and oriented to person, place, and time. No cranial nerve deficit. He exhibits normal muscle tone. Coordination normal.  Skin: Skin is warm and dry. No rash noted.  Psychiatric: He has a normal mood and affect.    ED Course  Procedures (including critical care time)  Labs Reviewed - No data to display Ct Head Wo Contrast  08/13/2011  *RADIOLOGY REPORT*  Clinical Data:  Fall from 6  foot ladder, loss of consciousness  CT HEAD WITHOUT CONTRAST CT CERVICAL SPINE WITHOUT CONTRAST  Technique:  Multidetector CT imaging of the head and cervical spine was performed following the standard protocol without intravenous contrast.  Multiplanar CT image reconstructions of the cervical spine were also generated.  Comparison:  No similar prior study is available for comparison.  CT HEAD  Findings: Examination is degraded by patient motion.  Images were repeated. No acute hemorrhage, acute infarction, or mass lesion is identified.  No midline shift.  No ventriculomegaly.  No skull fracture.  Prosthetic right globe or phthisis bulbi noted.  Left orbit is grossly unremarkable allowing for technique.  Paranasal sinuses are unremarkable.  IMPRESSION: No acute intracranial finding.  CT CERVICAL SPINE  Findings: C1 through the cervical thoracic junction is visualized in its entirety.  There is mild straightening of the normal cervical lordosis which may be positional. No precervical soft tissue widening is present.  Alignment is otherwise normal.  No loss of intervertebral disc space or vertebral body height.  No fracture or dislocation identified.  IMPRESSION: No acute cervical spine osseous abnormality.  Original Report Authenticated By: Harrel Lemon, M.D.   Ct Cervical Spine Wo Contrast  08/13/2011  *RADIOLOGY REPORT*  Clinical Data:  Fall from 6 foot ladder, loss of consciousness  CT HEAD WITHOUT CONTRAST CT CERVICAL SPINE WITHOUT CONTRAST  Technique:  Multidetector CT imaging of the head and cervical spine was performed following the standard protocol without intravenous contrast.  Multiplanar CT image reconstructions of the cervical spine were also generated.  Comparison:  No similar prior study is available for comparison.  CT HEAD  Findings: Examination is degraded by patient motion.  Images were repeated. No acute hemorrhage, acute infarction, or mass lesion is identified.  No midline shift.  No  ventriculomegaly.  No skull fracture.  Prosthetic right globe or phthisis bulbi noted.  Left orbit is grossly unremarkable allowing for technique.  Paranasal sinuses are unremarkable.  IMPRESSION: No acute intracranial finding.  CT CERVICAL SPINE  Findings: C1 through the cervical thoracic junction is visualized in its entirety.  There is mild straightening of the normal cervical lordosis which may be positional. No precervical soft tissue widening is present.  Alignment is otherwise normal.  No loss of intervertebral disc space or vertebral body height.  No fracture or dislocation identified.  IMPRESSION: No acute cervical spine osseous abnormality.  Original Report Authenticated By: Harrel Lemon, M.D.     1. Fall   2. Musculoskeletal pain       MDM  Patient suffered a mechanical fall of unknown height today. This is based on varying history from patient. Patient had plain films performed for all of his areas of concern regarding his pain. Additionally a head CT was performed as patient cannot say for sure whether he lost consciousness or not. No laboratory  studies are indicated. Return of all of patient's study showed no acute traumatic findings. Patient received morphine and Dilaudid IM for pain while here in the emergency department. He was discharged with prescription for Percocet and Flexeril. Patient was told to followup with his primary care physician. He did have some questions about treatment of his chronic injuries. I referred him to his primary care physician attempted to answer his questions the best my ability. Patient was discharged as mentioned previously.        Cyndra Numbers, MD 08/13/11 [phone]

## 2011-08-13 NOTE — ED Notes (Signed)
Pt stated that he fell. today off of a ladder. Now he [place] having sharp back pain. He is also having generalized rt side pain (where he landed) and both wrist are hurting. And patient stated that his left shoulder is stiff. Pt has a hx of chronic back pain and left shoulder pain. Will continue to monitor.

## 2011-08-13 NOTE — ED Notes (Signed)
sts was on 2 rung of ladder and complains of pain in left shoulder and neck back pain, right arm feels possible knocked out of joint. sts painful to move, hx of ddd, arthritis.

## 2015-06-12 ENCOUNTER — Telehealth: Payer: Self-pay | Admitting: "Endocrinology

## 2015-06-25 NOTE — Telephone Encounter (Signed)
Close encounter notes

## 2018-03-14 ENCOUNTER — Encounter: Payer: Self-pay | Admitting: Physician Assistant

## 2018-03-14 ENCOUNTER — Ambulatory Visit: Payer: Self-pay | Admitting: Physician Assistant

## 2018-03-14 VITALS — BP 176/88 | HR 67 | Temp 97.7°F | Ht 69.0 in | Wt 247.0 lb

## 2018-03-14 DIAGNOSIS — M545 Low back pain: Secondary | ICD-10-CM

## 2018-03-14 DIAGNOSIS — Z7689 Persons encountering health services in other specified circumstances: Secondary | ICD-10-CM

## 2018-03-14 DIAGNOSIS — Z13220 Encounter for screening for lipoid disorders: Secondary | ICD-10-CM

## 2018-03-14 DIAGNOSIS — I10 Essential (primary) hypertension: Secondary | ICD-10-CM

## 2018-03-14 DIAGNOSIS — Z125 Encounter for screening for malignant neoplasm of prostate: Secondary | ICD-10-CM

## 2018-03-14 DIAGNOSIS — Z131 Encounter for screening for diabetes mellitus: Secondary | ICD-10-CM

## 2018-03-14 DIAGNOSIS — G8929 Other chronic pain: Secondary | ICD-10-CM

## 2018-03-14 MED ORDER — LISINOPRIL 20 MG PO TABS: 20.0000 mg | ORAL_TABLET | Freq: Every day | ORAL | 1 refills | Status: DC

## 2018-03-14 MED ORDER — DICLOFENAC SODIUM 75 MG PO TBEC: 75.0000 mg | DELAYED_RELEASE_TABLET | Freq: Two times a day (BID) | ORAL | 1 refills | Status: DC | PRN

## 2018-03-14 NOTE — Progress Notes (Signed)
BP (!) 176/88 (BP Location: Right Arm, Patient Position: Sitting, Cuff Size: Normal)   Pulse 67   Temp 97.7 F (36.5 C) (Other (Comment))   Ht 5\' 9"  (1.753 m)   Wt 247 lb (112 kg)   SpO2 97%   BMI 36.48 kg/m    Subjective:    Patient ID: Shawn Pittman, male    DOB: 28-May-1966, 52 y.o.   MRN: [id number]  HPI: Shawn Pittman is a 52 y.o. male presenting on 03/14/2018 for New Patient (Initial Visit) ("was incarcerated for 7 years, released 2 weeks ago")   HPI   Chief Complaint  Patient presents with  . 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 Interim medical history since our last visit reviewed. Allergies and medications reviewed and updated.   Current Outpatient Medications:  .  ibuprofen (ADVIL,MOTRIN) 200 MG tablet, Take 800 mg by mouth 2 (two) times daily as needed., Disp: , Rfl:  .  naproxen sodium (ALEVE) 220 MG tablet, Take 440 mg by mouth 2 (two) times daily as needed., Disp: , Rfl:    Review of Systems  Constitutional: Negative for appetite change, chills, diaphoresis, fatigue, fever and unexpected weight change.  HENT: Positive for hearing loss. Negative for congestion, dental problem, drooling, ear pain, facial swelling, mouth sores, sneezing, sore throat, trouble swallowing and voice change.   Eyes: Positive for visual disturbance. Negative for pain, discharge, redness and itching.  Respiratory: Negative for cough, choking, shortness of breath and wheezing.   Cardiovascular: Negative for chest pain, palpitations and leg swelling.  Gastrointestinal: Negative for abdominal pain, blood in stool, constipation, diarrhea and vomiting.  Endocrine: Negative for cold intolerance, heat  intolerance and polydipsia.  Genitourinary: Negative for decreased urine volume, dysuria and hematuria.  Musculoskeletal: Positive for back pain. Negative for arthralgias and gait problem.  Skin: Negative for rash.  Allergic/Immunologic: Negative for environmental allergies.  Neurological: Negative for seizures, syncope, light-headedness and headaches.  Hematological: Negative for adenopathy.  Psychiatric/Behavioral: Negative for agitation, dysphoric mood and suicidal ideas. The patient is not nervous/anxious.     Per HPI unless specifically indicated above     Objective:    BP (!) 176/88 (BP Location: Right Arm, Patient Position: Sitting, Cuff Size: Normal)   Pulse 67   Temp 97.7 F (36.5 C) (Other (Comment))   Ht 5\' 9"  (1.753 m)   Wt 247 lb (112 kg)   SpO2 97%   BMI 36.48 kg/m   Wt Readings from Last 3 Encounters:  03/14/18 247 lb (112 kg)    Physical Exam  Constitutional: He is oriented to person, place, and time. He appears well-developed and well-nourished.  HENT:  Head: Normocephalic and atraumatic.  Mouth/Throat: Oropharynx is clear and moist. No oropharyngeal exudate.  Eyes: Pupils are equal, round, and reactive to light. Conjunctivae and EOM are normal.  Neck: Neck supple. No thyromegaly present.  Cardiovascular: Normal rate and regular rhythm.  Pulmonary/Chest: Effort normal and breath sounds normal. He has no wheezes. He has no rales.  Abdominal: Soft. Bowel sounds are normal. He exhibits no mass. There is no hepatosplenomegaly. There is no tenderness.  Musculoskeletal: He exhibits no edema.  Lymphadenopathy:  He has no cervical adenopathy.  Neurological: He is alert and oriented to person, place, and time.  Skin: Skin is warm and dry. No rash noted.  Psychiatric: He has a normal mood and affect. His behavior is normal. Thought content normal.  Vitals reviewed.          Assessment & Plan:   Encounter Diagnoses  Name Primary?  . Encounter to establish  care Yes  . Essential hypertension   . Chronic low back pain, unspecified back pain laterality, with sciatica presence unspecified   . Screening cholesterol level   . Screening for diabetes mellitus   . Screening for prostate cancer     -will get baseline Labs -rx Lisinopril for htn -will get Xray lumbar spine and Orthopedic referral for LBP after xray done -gave pt [employer] application -Follow up 4 wk.  RTO sooner prn

## 2018-03-15 ENCOUNTER — Other Ambulatory Visit (HOSPITAL_COMMUNITY)
Admission: RE | Admit: 2018-03-15 | Discharge: 2018-03-15 | Disposition: A | Discharge status: Home / Self Care | Payer: Self-pay | Source: Ambulatory Visit | Attending: Physician Assistant | Admitting: Physician Assistant

## 2018-03-15 ENCOUNTER — Ambulatory Visit (HOSPITAL_COMMUNITY)
Admission: RE | Admit: 2018-03-15 | Discharge: 2018-03-15 | Disposition: A | Discharge status: Home / Self Care | Payer: Self-pay | Source: Ambulatory Visit | Attending: Physician Assistant | Admitting: Physician Assistant

## 2018-03-15 DIAGNOSIS — G8929 Other chronic pain: Secondary | ICD-10-CM | POA: Insufficient documentation

## 2018-03-15 DIAGNOSIS — M545 Low back pain: Secondary | ICD-10-CM | POA: Insufficient documentation

## 2018-03-15 DIAGNOSIS — Z13220 Encounter for screening for lipoid disorders: Secondary | ICD-10-CM | POA: Insufficient documentation

## 2018-03-15 DIAGNOSIS — M48061 Spinal stenosis, lumbar region without neurogenic claudication: Secondary | ICD-10-CM | POA: Insufficient documentation

## 2018-03-15 DIAGNOSIS — I10 Essential (primary) hypertension: Secondary | ICD-10-CM | POA: Insufficient documentation

## 2018-03-15 DIAGNOSIS — Z125 Encounter for screening for malignant neoplasm of prostate: Secondary | ICD-10-CM | POA: Insufficient documentation

## 2018-03-15 DIAGNOSIS — M47816 Spondylosis without myelopathy or radiculopathy, lumbar region: Secondary | ICD-10-CM | POA: Insufficient documentation

## 2018-03-15 DIAGNOSIS — Z131 Encounter for screening for diabetes mellitus: Secondary | ICD-10-CM | POA: Insufficient documentation

## 2018-03-15 LAB — COMPREHENSIVE METABOLIC PANEL
ALT: 32 U/L (ref 0–44)
AST: 31 U/L (ref 15–41)
Albumin: 4.6 g/dL (ref 3.5–5.0)
Alkaline Phosphatase: 60 U/L (ref 38–126)
Anion gap: 8 (ref 5–15)
BUN: 19 mg/dL (ref 6–20)
CALCIUM: 9 mg/dL (ref 8.9–10.3)
CHLORIDE: 108 mmol/L (ref 98–111)
CO2: 26 mmol/L (ref 22–32)
CREATININE: 0.98 mg/dL (ref 0.61–1.24)
GFR calc non Af Amer: >60 mL/min (ref >60)
GLUCOSE: 102 mg/dL — AB (ref 70–99)
Potassium: 3.3 mmol/L — ABNORMAL LOW (ref 3.5–5.1)
SODIUM: 142 mmol/L (ref 135–145)
Total Bilirubin: 1.2 mg/dL (ref 0.3–1.2)
Total Protein: 7.2 g/dL (ref 6.5–8.1)

## 2018-03-15 LAB — LIPID PANEL
Cholesterol: 193 mg/dL (ref 0–200)
HDL: 31 mg/dL — AB (ref >40)
LDL CALC: 87 mg/dL (ref 0–99)
Total CHOL/HDL Ratio: 6.2 RATIO
Triglycerides: 373 mg/dL — ABNORMAL HIGH (ref <150)
VLDL: 75 mg/dL — ABNORMAL HIGH (ref 0–40)

## 2018-03-15 LAB — HEMOGLOBIN A1C
HEMOGLOBIN A1C: 4.8 % (ref 4.8–5.6)
MEAN PLASMA GLUCOSE: 91.06 mg/dL

## 2018-03-15 LAB — PSA: PROSTATIC SPECIFIC ANTIGEN: 0.5 ng/mL (ref 0.00–4.00)

## 2018-04-11 ENCOUNTER — Ambulatory Visit: Payer: Self-pay | Admitting: Physician Assistant

## 2018-04-11 ENCOUNTER — Other Ambulatory Visit (HOSPITAL_COMMUNITY)
Admission: RE | Admit: 2018-04-11 | Discharge: 2018-04-11 | Disposition: A | Discharge status: Home / Self Care | Payer: Self-pay | Source: Ambulatory Visit | Attending: Physician Assistant | Admitting: Physician Assistant

## 2018-04-11 ENCOUNTER — Encounter: Payer: Self-pay | Admitting: Physician Assistant

## 2018-04-11 VITALS — BP 145/82 | HR 63 | Temp 97.9°F | Ht 69.0 in | Wt 241.2 lb

## 2018-04-11 DIAGNOSIS — E876 Hypokalemia: Secondary | ICD-10-CM

## 2018-04-11 DIAGNOSIS — E669 Obesity, unspecified: Secondary | ICD-10-CM

## 2018-04-11 DIAGNOSIS — E781 Pure hyperglyceridemia: Secondary | ICD-10-CM

## 2018-04-11 DIAGNOSIS — M79641 Pain in right hand: Secondary | ICD-10-CM

## 2018-04-11 DIAGNOSIS — I10 Essential (primary) hypertension: Secondary | ICD-10-CM

## 2018-04-11 DIAGNOSIS — M79642 Pain in left hand: Secondary | ICD-10-CM

## 2018-04-11 DIAGNOSIS — M545 Low back pain: Secondary | ICD-10-CM

## 2018-04-11 DIAGNOSIS — M5136 Other intervertebral disc degeneration, lumbar region: Secondary | ICD-10-CM

## 2018-04-11 DIAGNOSIS — G8929 Other chronic pain: Secondary | ICD-10-CM

## 2018-04-11 LAB — POTASSIUM: Potassium: 3.7 mmol/L (ref 3.5–5.1)
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
## 2018-04-11 NOTE — Patient Instructions (Addendum)
Cholesterol Cholesterol is a white, waxy, fat-like substance that is needed by the human body in small amounts. The liver makes all the cholesterol we need. Cholesterol is carried from the liver by the blood through the blood vessels. Deposits of cholesterol (plaques) may build up on blood vessel (artery) walls. Plaques make the arteries narrower and stiffer. Cholesterol plaques increase the risk for heart attack and stroke. You cannot feel your cholesterol level even if it is very high. The only way to know that it is high is to have a blood test. Once you know your cholesterol levels, you should keep a record of the test results. Work with your health care provider to keep your levels in the desired range. What do the results mean?  Total cholesterol is a rough measure of all the cholesterol in your blood.  LDL (low-density lipoprotein) is the "bad" cholesterol. This is the type that causes plaque to build up on the artery walls. You want this level to be low.  HDL (high-density lipoprotein) is the "good" cholesterol because it cleans the arteries and carries the LDL away. You want this level to be high.  Triglycerides are fat that the body can either burn for energy or store. High levels are closely linked to heart disease. What are the desired levels of cholesterol?  Total cholesterol below 200.  LDL below 100 for people who are at risk, below 70 for people at very high risk.  HDL above 40 is good. A level of 60 or higher is considered to be protective against heart disease.  Triglycerides below 150. How can I lower my cholesterol? Diet Follow your diet program as told by your health care provider.  Choose fish or white meat chicken and [place], roasted or baked. Limit fatty cuts of red meat, fried foods, and processed meats, such as sausage and lunch meats.  Eat lots of fresh fruits and vegetables.  Choose whole grains, beans, pasta, potatoes, and cereals.  Choose olive oil, corn  oil, or canola oil, and use only small amounts.  Avoid butter, mayonnaise, shortening, or palm kernel oils.  Avoid foods with trans fats.  Drink skim or nonfat milk and eat low-fat or nonfat yogurt and cheeses. Avoid whole milk, cream, ice cream, egg yolks, and full-fat cheeses.  Healthier desserts include angel food cake, ginger snaps, animal crackers, hard candy, popsicles, and low-fat or nonfat frozen yogurt. Avoid pastries, cakes, pies, and cookies.  Exercise  Follow your exercise program as told by your health care provider. A regular program: ? Helps to decrease LDL and raise HDL. ? Helps with weight control.  Do things that increase your activity level, such as gardening, walking, and taking the stairs.  Ask your health care provider about ways that you can be more active in your daily life.  Medicine  Take over-the-counter and prescription medicines only as told by your health care provider. ? Medicine may be prescribed by your health care provider to help lower cholesterol and decrease the risk for heart disease. This is usually done if diet and exercise have failed to bring down cholesterol levels. ? If you have several risk factors, you may need medicine even if your levels are normal.  This information is not intended to replace advice given to you by your health care provider. Make sure you discuss any questions you have with your health care provider. Document Released: 04/05/2001 Document Revised: 02/06/2016 Document Reviewed: 01/09/2016 Elsevier Interactive Patient Education  [employer]2018 Else[employer] Inc.      ----------------------------------------------------------------------------------------  DASH Eating Plan DASH stands for "Dietary Approaches to Stop Hypertension." The DASH eating plan is a healthy eating plan that has been shown to reduce high blood pressure (hypertension). It may also reduce your risk for type 2 diabetes, heart disease, and stroke. The DASH  eating plan may also help with weight loss. What are tips for following this plan? General guidelines  Avoid eating more than 2,300 mg (milligrams) of salt (sodium) a day. If you have hypertension, you may need to reduce your sodium intake to 1,500 mg a day.  Limit alcohol intake to no more than 1 drink a day for nonpregnant women and 2 drinks a day for men. One drink equals 12 oz of beer, 5 oz of wine, or 1 oz of hard liquor.  Work with your health care provider to maintain a healthy body weight or to lose weight. Ask what an ideal weight is for you.  Get at least 30 minutes of exercise that causes your heart to beat faster (aerobic exercise) most days of the week. Activities may include walking, swimming, or biking.  Work with your health care provider or diet and nutrition specialist (dietitian) to adjust your eating plan to your individual calorie needs. Reading food labels  Check food labels for the amount of sodium per serving. Choose foods with less than 5 percent of the Daily Value of sodium. Generally, foods with less than 300 mg of sodium per serving fit into this eating plan.  To find whole grains, look for the word "whole" as the first word in the ingredient list. Shopping  Buy products labeled as "low-sodium" or "no salt added."  Buy fresh foods. Avoid canned foods and premade or frozen meals. Cooking  Avoid adding salt when cooking. Use salt-free seasonings or herbs instead of table salt or sea salt. Check with your health care provider or pharmacist before using salt substitutes.  Do not fry foods. Cook foods using healthy methods such as baking, boiling, grilling, and broiling instead.  Cook with heart-healthy oils, such as olive, canola, soybean, or sunflower oil. Meal planning   Eat a balanced diet that includes: ? 5 or more servings of fruits and vegetables each day. At each meal, try to fill half of your plate with fruits and vegetables. ? Up to 6-8 servings of  whole grains each day. ? Less than 6 oz of lean meat, poultry, or fish each day. A 3-oz serving of meat is about the same size as a deck of cards. One egg equals 1 oz. ? 2 servings of low-fat dairy each day. ? A serving of nuts, seeds, or beans 5 times each week. ? Heart-healthy fats. Healthy fats called Omega-3 fatty acids are found in foods such as flaxseeds and coldwater fish, like sardines, salmon, and mackerel.  Limit how much you eat of the following: ? Canned or prepackaged foods. ? Food that is high in trans fat, such as fried foods. ? Food that is high in saturated fat, such as fatty meat. ? Sweets, desserts, sugary drinks, and other foods with added sugar. ? Full-fat dairy products.  Do not salt foods before eating.  Try to eat at least 2 vegetarian meals each week.  Eat more home-cooked food and less restaurant, buffet, and fast food.  When eating at a restaurant, ask that your food be prepared with less salt or no salt, if possible. What foods are recommended? The items listed may not be a complete list. Talk with your dietitian about  what dietary choices are best for you. Grains Whole-grain or whole-wheat bread. Whole-grain or whole-wheat pasta. Brown rice. Orpah Cobb. Bulgur. Whole-grain and low-sodium cereals. Pita bread. Low-fat, low-sodium crackers. Whole-wheat flour tortillas. Vegetables Fresh or frozen vegetables (raw, steamed, roasted, or grilled). Low-sodium or reduced-sodium tomato and vegetable juice. Low-sodium or reduced-sodium tomato sauce and tomato paste. Low-sodium or reduced-sodium canned vegetables. Fruits All fresh, dried, or frozen fruit. Canned fruit in natural juice (without added sugar). Meat and other protein foods Skinless chicken or [place]. Ground chicken or [place]. Pork with fat trimmed off. Fish and seafood. Egg whites. Dried beans, peas, or lentils. Unsalted nuts, nut butters, and seeds. Unsalted canned beans. Lean cuts of beef with fat  trimmed off. Low-sodium, lean deli meat. Dairy Low-fat (1%) or fat-free (skim) milk. Fat-free, low-fat, or reduced-fat cheeses. Nonfat, low-sodium ricotta or cottage cheese. Low-fat or nonfat yogurt. Low-fat, low-sodium cheese. Fats and oils Soft margarine without trans fats. Vegetable oil. Low-fat, reduced-fat, or light mayonnaise and salad dressings (reduced-sodium). Canola, safflower, olive, soybean, and sunflower oils. Avocado. Seasoning and other foods Herbs. Spices. Seasoning mixes without salt. Unsalted popcorn and pretzels. Fat-free sweets. What foods are not recommended? The items listed may not be a complete list. Talk with your dietitian about what dietary choices are best for you. Grains Baked goods made with fat, such as croissants, muffins, or some breads. Dry pasta or rice meal packs. Vegetables Creamed or fried vegetables. Vegetables in a cheese sauce. Regular canned vegetables (not low-sodium or reduced-sodium). Regular canned tomato sauce and paste (not low-sodium or reduced-sodium). Regular tomato and vegetable juice (not low-sodium or reduced-sodium). Rosita Fire. Olives. Fruits Canned fruit in a light or heavy syrup. Fried fruit. Fruit in cream or butter sauce. Meat and other protein foods Fatty cuts of meat. Ribs. Fried meat. Tomasa Blase. Sausage. Bologna and other processed lunch meats. Salami. Fatback. Hotdogs. Bratwurst. Salted nuts and seeds. Canned beans with added salt. Canned or smoked fish. Whole eggs or egg yolks. Chicken or [place] with skin. Dairy Whole or 2% milk, cream, and half-and-half. Whole or full-fat cream cheese. Whole-fat or sweetened yogurt. Full-fat cheese. Nondairy creamers. Whipped toppings. Processed cheese and cheese spreads. Fats and oils Butter. Stick margarine. Lard. Shortening. Ghee. Bacon fat. Tropical oils, such as coconut, palm kernel, or palm oil. Seasoning and other foods Salted popcorn and pretzels. Onion salt, garlic salt, seasoned salt, table  salt, and sea salt. Worcestershire sauce. Tartar sauce. Barbecue sauce. Teriyaki sauce. Soy sauce, including reduced-sodium. Steak sauce. Canned and packaged gravies. Fish sauce. Oyster sauce. Cocktail sauce. Horseradish that you find on the shelf. Ketchup. Mustard. Meat flavorings and tenderizers. Bouillon cubes. Hot sauce and Tabasco sauce. Premade or packaged marinades. Premade or packaged taco seasonings. Relishes. Regular salad dressings. Where to find more information:  National Heart, Lung, and Blood Institute: PopSteam.is  American Heart Association: www.heart.org Summary  The DASH eating plan is a healthy eating plan that has been shown to reduce high blood pressure (hypertension). It may also reduce your risk for type 2 diabetes, heart disease, and stroke.  With the DASH eating plan, you should limit salt (sodium) intake to 2,300 mg a day. If you have hypertension, you may need to reduce your sodium intake to 1,500 mg a day.  When on the DASH eating plan, aim to eat more fresh fruits and vegetables, whole grains, lean proteins, low-fat dairy, and heart-healthy fats.  Work with your health care provider or diet and nutrition specialist (dietitian) to adjust your eating plan to your  individual calorie needs. This information is not intended to replace advice given to you by your health care provider. Make sure you discuss any questions you have with your health care provider. Document Released: 06/30/2011 Document Revised: 07/04/2016 Document Reviewed: 07/04/2016 Elsevier Interactive Patient Education  [employer].

## 2018-04-12 MED ORDER — LISINOPRIL 20 MG PO TABS: 20.0000 mg | ORAL_TABLET | Freq: Every day | ORAL | 1 refills | Status: DC

## 2018-05-07 ENCOUNTER — Ambulatory Visit: Payer: Self-pay | Admitting: Physician Assistant

## 2018-05-07 VITALS — BP 178/93 | HR 84 | Temp 97.9°F

## 2018-05-07 DIAGNOSIS — I10 Essential (primary) hypertension: Secondary | ICD-10-CM

## 2018-05-07 DIAGNOSIS — Z970 Presence of artificial eye: Secondary | ICD-10-CM

## 2018-05-07 DIAGNOSIS — T1512XA Foreign body in conjunctival sac, left eye, initial encounter: Secondary | ICD-10-CM

## 2018-05-07 MED ORDER — LISINOPRIL 20 MG PO TABS: 40.0000 mg | ORAL_TABLET | Freq: Every day | ORAL | 3 refills | Status: DC

## 2018-05-07 NOTE — Patient Instructions (Signed)
[facility] [address], [place], [place] [address]  Monday May 14, 2018 8:10am

## 2018-05-07 NOTE — Progress Notes (Signed)
BP (!) 178/93   Pulse 84   Temp 97.9 F (36.6 C)   SpO2 99%    Subjective:    Patient ID: Shawn Pittman, male    DOB: [birth year], 52 y.o.   MRN: [id number]  HPI: Shawn Pittman is a 52 y.o. male presenting on 05/07/2018 for Eye Problem   HPI   Pt says he got something in his eye for over a week.  He said he thought it would work it's out.  He doesn't know what it is- could be metal shaving or piece of wood as he does grinding etc.  He does wear safety glasses.   He says everything see,s a bit blurry over the past week.   He does wear glasses but not contact lenses.    Relevant past medical, surgical, family and social history reviewed and updated as indicated. Interim medical history since our last visit reviewed. Allergies and medications reviewed and updated.   Current Outpatient Medications:  .  lisinopril (PRINIVIL,ZESTRIL) 20 MG tablet, Take 1 tablet (20 mg total) by mouth daily., Disp: 30 tablet, Rfl: 1 .  naproxen sodium (ALEVE) 220 MG tablet, Take 440 mg by mouth 2 (two) times daily as needed., Disp: , Rfl:  .  Omega-3 Fatty Acids (FISH OIL PO), Take 1 capsule by mouth daily., Disp: , Rfl:    Review of Systems  Constitutional: Negative for appetite change, chills, diaphoresis, fatigue, fever and unexpected weight change.  HENT: Positive for dental problem and hearing loss. Negative for congestion, drooling, ear pain, facial swelling, mouth sores, sneezing, sore throat, trouble swallowing and voice change.   Eyes: Positive for pain, redness and itching. Negative for discharge and visual disturbance.  Respiratory: Negative for cough, choking, shortness of breath and wheezing.   Cardiovascular: Negative for chest pain, palpitations and leg swelling.  Gastrointestinal: Positive for constipation. Negative for abdominal pain, blood in stool, diarrhea and vomiting.  Endocrine: Negative for cold intolerance, heat intolerance and polydipsia.  Genitourinary: Negative for  decreased urine volume, dysuria and hematuria.  Musculoskeletal: Positive for back pain. Negative for arthralgias and gait problem.  Skin: Negative for rash.  Allergic/Immunologic: Negative for environmental allergies.  Neurological: Negative for seizures, syncope, light-headedness and headaches.  Hematological: Negative for adenopathy.  Psychiatric/Behavioral: Negative for agitation, dysphoric mood and suicidal ideas. The patient is not nervous/anxious.     Per HPI unless specifically indicated above     Objective:    BP (!) 178/93   Pulse 84   Temp 97.9 F (36.6 C)   SpO2 99%   Wt Readings from Last 3 Encounters:  04/11/18 241 lb 4 oz (109.4 kg)  03/14/18 247 lb (112 kg)    Physical Exam  Constitutional: He is oriented to person, place, and time. He appears well-developed and well-nourished.  HENT:  Head: Normocephalic and atraumatic.  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  EOM intact. 1 gtt tetracaine to OS.   Fluorescein instilled and exam with woods lamp reveals no defect.   There appears to be small black foreign body embedded in palpebral conjunctiva at the 4 o'clock position   Pulmonary/Chest: Effort normal. No respiratory distress.  Neurological: He is alert and oriented to person, place, and time.  Skin: Skin is warm and dry.  Psychiatric: He has a normal mood and affect. His behavior is normal. Thought content normal.  Vitals reviewed.    Visual acutiy 20/50 L eye uncorrected  Assessment & Plan:    Encounter Diagnoses  Name Primary?  . Foreign body of left conjunctiva, initial encounter Yes  . Essential hypertension   . Prosthetic eye globe      -will increase lisinopril to 40mg  po qd for blood pressure -appointment with [facility] was scheduled for next Monday, October 21.   -pt will follow up in 4 weeks to recheck blood pressure.  pt to RTO sooner for any worsening or new symptoms

## 2018-05-09 ENCOUNTER — Ambulatory Visit (INDEPENDENT_AMBULATORY_CARE_PROVIDER_SITE_OTHER): Payer: Self-pay | Admitting: Orthopaedic Surgery

## 2018-05-09 ENCOUNTER — Encounter: Payer: Self-pay | Admitting: Orthopaedic Surgery

## 2018-05-09 VITALS — BP 147/88 | HR 70 | Ht 70.0 in | Wt 243.0 lb

## 2018-05-09 DIAGNOSIS — G8929 Other chronic pain: Secondary | ICD-10-CM

## 2018-05-09 DIAGNOSIS — M5442 Lumbago with sciatica, left side: Secondary | ICD-10-CM

## 2018-05-09 DIAGNOSIS — M5441 Lumbago with sciatica, right side: Secondary | ICD-10-CM

## 2018-05-09 MED ORDER — TRAMADOL HCL 50 MG PO TABS: ORAL_TABLET | ORAL | 0 refills | Status: DC

## 2018-05-09 NOTE — Patient Instructions (Addendum)
An MRI has been scheduled  For you at [facility].  Please go to the Radiology department at 9:30am in order to complete the paperwork for a 10:00am appointment.  Monday 05/14/18.

## 2018-05-09 NOTE — Addendum Note (Signed)
Addended by: Earnstine Regal on: 05/09/2018 08:22 AM   Modules accepted: Orders

## 2018-05-09 NOTE — Progress Notes (Signed)
Subjective:    Patient ID: Shawn Pittman, male    DOB: 1965-08-13, 52 y.o.   MRN: [id number]  HPI He has a long history of lower back pain.  He had MRI done in 2012 showing disc bulging.  He has been in prison for seven years and just got out recently.  His pain is worse.  He has pain running down both legs.  He has no trauma, no weakness.  He is taking Aleve that helps a little.  He has no trauma.  He was seen in the ER recently and had x-rays showing progression of his lumbar disc disease, more at lower level.  I will get MRI of the lumbar spine.  I will call in Tramadol for pain.   Review of Systems  Constitutional: Positive for activity change.  Eyes:       False eye right  Musculoskeletal: Positive for arthralgias and back pain.  All other systems reviewed and are negative.  For Review of Systems, all other systems reviewed and are negative.  The following is a summary of the past history medically, past history surgically, known current medicines, social history and family history.  This information is gathered electronically by the computer from prior information and documentation.  I review this each visit and have found including this information at this point in the chart is beneficial and informative.   Past Medical History:  Diagnosis Date  . Back pain, chronic   . Hyperlipidemia   . Hypertension   . MRSA (methicillin resistant Staphylococcus aureus)     Past Surgical History:  Procedure Laterality Date  . EYE SURGERY  1989   removed R eye    Current Outpatient Medications on File Prior to Visit  Medication Sig Dispense Refill  . lisinopril (PRINIVIL,ZESTRIL) 20 MG tablet Take 2 tablets (40 mg total) by mouth daily. 60 tablet 3  . naproxen sodium (ALEVE) 220 MG tablet Take 440 mg by mouth 2 (two) times daily as needed.    . Omega-3 Fatty Acids (FISH OIL PO) Take 1 capsule by mouth daily.     No current facility-administered medications on file prior to  visit.     Social History   Socioeconomic History  . Marital status: Single    Spouse name: Not on file  . Number of children: Not on file  . Years of education: Not on file  . Highest education level: Not on file  Occupational History  . Not on file  Social Needs  . Financial resource strain: Not on file  . Food insecurity:    Worry: Not on file    Inability: Not on file  . Transportation needs:    Medical: Not on file    Non-medical: Not on file  Tobacco Use  . Smoking status: Never Smoker  . Smokeless tobacco: Never Used  Substance and Sexual Activity  . Alcohol use: Not Currently    Comment: no ETOH since 2000  . Drug use: Not Currently    Types: Marijuana, Cocaine    Comment: none since 1998  . Sexual activity: Yes    Partners: Female    Comment: wife had implant  Lifestyle  . Physical activity:    Days per week: Not on file    Minutes per session: Not on file  . Stress: Not on file  Relationships  . Social connections:    Talks on phone: Not on file    Gets together: Not on file  Attends religious service: Not on file    Active member of club or organization: Not on file    Attends meetings of clubs or organizations: Not on file    Relationship status: Not on file  . Intimate partner violence:    Fear of current or ex partner: Not on file    Emotionally abused: Not on file    Physically abused: Not on file    Forced sexual activity: Not on file  Other Topics Concern  . Not on file  Social History Narrative  . Not on file    Family History  Problem Relation Age of Onset  . Heart attack Mother   . Cancer Father     BP (!) 147/88   Pulse 70   Ht 5\' 10"  (1.778 m)   Wt 243 lb (110.2 kg)   BMI 34.87 kg/m   Body mass index is 34.87 kg/m.      Objective:   Physical Exam  Constitutional: He is oriented to person, place, and time. He appears well-developed and well-nourished.  HENT:  Head: Normocephalic and atraumatic.  False eye right    Eyes: Pupils are equal, round, and reactive to light. Conjunctivae and EOM are normal.  Neck: Normal range of motion. Neck supple.  Cardiovascular: Normal rate, regular rhythm and intact distal pulses.  Pulmonary/Chest: Effort normal.  Abdominal: Soft.  Musculoskeletal:       Back:  Neurological: He is alert and oriented to person, place, and time. He has normal reflexes. He displays normal reflexes. No cranial nerve deficit. He exhibits normal muscle tone. Coordination normal.  Skin: Skin is warm and dry.  Psychiatric: He has a normal mood and affect. His behavior is normal. Judgment and thought content normal.     I have reviewed his ER notes and x-rays and report.     Assessment & Plan:   Encounter Diagnosis  Name Primary?  . Chronic bilateral low back pain with bilateral sciatica Yes   Tramadol given.  I have reviewed the [place] Controlled Substance Reporting System web site prior to prescribing narcotic medicine for this patient.   Get MRI of the lumbar spine.  Return after the MRI.  Call if any problem.  Precautions discussed.   Electronically Signed Darreld Mclean, MD 10/16/20198:19 AM

## 2018-05-14 ENCOUNTER — Ambulatory Visit (HOSPITAL_COMMUNITY): Payer: Self-pay

## 2018-05-17 ENCOUNTER — Ambulatory Visit (HOSPITAL_COMMUNITY)
Admission: RE | Admit: 2018-05-17 | Discharge: 2018-05-17 | Disposition: A | Discharge status: Home / Self Care | Payer: Self-pay | Source: Ambulatory Visit | Attending: Orthopaedic Surgery | Admitting: Orthopaedic Surgery

## 2018-05-17 ENCOUNTER — Other Ambulatory Visit: Payer: Self-pay | Admitting: Orthopaedic Surgery

## 2018-05-17 ENCOUNTER — Telehealth: Payer: Self-pay | Admitting: Radiology

## 2018-05-17 DIAGNOSIS — S0591XA Unspecified injury of right eye and orbit, initial encounter: Secondary | ICD-10-CM

## 2018-05-17 DIAGNOSIS — S0592XA Unspecified injury of left eye and orbit, initial encounter: Principal | ICD-10-CM

## 2018-05-17 DIAGNOSIS — T1590XA Foreign body on external eye, part unspecified, unspecified eye, initial encounter: Secondary | ICD-10-CM

## 2018-05-17 DIAGNOSIS — G8929 Other chronic pain: Secondary | ICD-10-CM | POA: Insufficient documentation

## 2018-05-17 DIAGNOSIS — M5441 Lumbago with sciatica, right side: Secondary | ICD-10-CM

## 2018-05-17 DIAGNOSIS — M48061 Spinal stenosis, lumbar region without neurogenic claudication: Secondary | ICD-10-CM | POA: Insufficient documentation

## 2018-05-17 DIAGNOSIS — M5116 Intervertebral disc disorders with radiculopathy, lumbar region: Secondary | ICD-10-CM | POA: Insufficient documentation

## 2018-05-17 DIAGNOSIS — M5442 Lumbago with sciatica, left side: Secondary | ICD-10-CM

## 2018-05-17 NOTE — Telephone Encounter (Signed)
Patient having MRI today and needed FB eye films to clear first. Ordered per protocol. He had recent eye injury and needs cleared first.

## 2018-05-21 ENCOUNTER — Ambulatory Visit: Payer: Self-pay | Admitting: Physician Assistant

## 2018-05-22 ENCOUNTER — Ambulatory Visit (INDEPENDENT_AMBULATORY_CARE_PROVIDER_SITE_OTHER): Payer: Self-pay | Admitting: Orthopaedic Surgery

## 2018-05-22 ENCOUNTER — Encounter: Payer: Self-pay | Admitting: Orthopaedic Surgery

## 2018-05-22 VITALS — BP 151/87 | HR 79 | Ht 70.0 in | Wt 243.0 lb

## 2018-05-22 DIAGNOSIS — G8929 Other chronic pain: Secondary | ICD-10-CM

## 2018-05-22 DIAGNOSIS — M5442 Lumbago with sciatica, left side: Secondary | ICD-10-CM

## 2018-05-22 DIAGNOSIS — M5441 Lumbago with sciatica, right side: Secondary | ICD-10-CM

## 2018-05-22 MED ORDER — TRAMADOL HCL 50 MG PO TABS: ORAL_TABLET | ORAL | 2 refills | Status: DC

## 2018-05-22 NOTE — Progress Notes (Signed)
Patient ZO:XWRUEA Shawn Pittman, male DOB:September 05, 1965, 52 y.o. VWU:[id number]  Chief Complaint  Patient presents with  . Back Pain  . Results    review MRI     HPI  Shawn Pittman is a 52 y.o. male who has lower back pain.  He has pain still.  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 Mild bilateral foraminal narrowing is also present at this level.  No change in moderate central canal stenosis and narrowing in both subarticular recesses at L4-5. There is encroachment on the descending L5 roots. Mild left foraminal narrowing at this level is noted.  Mild to moderate bilateral foraminal narrowing at L5-S1 has increased since the prior examination. The central canal is widely patent at this level.  I offered epidural injection but he said he had one in the past and it did not help.  He would prefer to continue his weight reduction and work outs at [employer].  We went over exercises with him.  I will refill his Ultram.   Body mass index is 34.87 kg/m.  ROS  Review of Systems  Constitutional: Positive for activity change.  Eyes:       False eye right  Musculoskeletal: Positive for arthralgias and back pain.  All other systems reviewed and are negative.   All other systems reviewed and are negative.  The following is a summary of the past history medically, past history surgically, known current medicines, social history and family history.  This information is gathered electronically by the computer from prior information and documentation.  I review this each visit and have found including this information at this point in the chart is beneficial and informative.    Past Medical History:  Diagnosis Date  . Back pain, chronic   . Hyperlipidemia   . Hypertension   . MRSA (methicillin resistant  Staphylococcus aureus)     Past Surgical History:  Procedure Laterality Date  . EYE SURGERY  1989   removed R eye    Family History  Problem Relation Age of Onset  . Heart attack Mother   . Cancer Father     Social History Social History   Tobacco Use  . Smoking status: Never Smoker  . Smokeless tobacco: Never Used  Substance Use Topics  . Alcohol use: Not Currently    Comment: no ETOH since 2000  . Drug use: Not Currently    Types: Marijuana, Cocaine    Comment: none since 1998    No Known Allergies  Current Outpatient Medications  Medication Sig Dispense Refill  . lisinopril (PRINIVIL,ZESTRIL) 20 MG tablet Take 2 tablets (40 mg total) by mouth daily. 60 tablet 3  . naproxen sodium (ALEVE) 220 MG tablet Take 440 mg by mouth 2 (two) times daily as needed.    . Omega-3 Fatty Acids (FISH OIL PO) Take 1 capsule by mouth daily.    . traMADol (ULTRAM) 50 MG tablet One tablet every six hours as needed for pain. 50 tablet 2   No current facility-administered medications for this visit.      Physical Exam  Blood pressure (!) 151/87, pulse 79, height 5\' 10"  (1.778 m), weight 243 lb (110.2 kg).  Constitutional: overall normal hygiene, normal nutrition, well developed, normal grooming, normal body habitus. Assistive device:none  Musculoskeletal: gait and station Limp none, muscle  tone and strength are normal, no tremors or atrophy is present.  .  Neurological: coordination overall normal.  Deep tendon reflex/nerve stretch intact.  Sensation normal.  Cranial nerves II-XII intact.   Skin:   Normal overall no scars, lesions, ulcers or rashes. No psoriasis.  Psychiatric: Alert and oriented x 3.  Recent memory intact, remote memory unclear.  Normal mood and affect. Well groomed.  Good eye contact.  Cardiovascular: overall no swelling, no varicosities, no edema bilaterally, normal temperatures of the legs and arms, no clubbing, cyanosis and good capillary refill.  Lymphatic:  palpation is normal.  Spine/Pelvis examination:  Inspection:  Overall, sacoiliac joint benign and hips nontender; without crepitus or defects.   Thoracic spine inspection: Alignment normal without kyphosis present   Lumbar spine inspection:  Alignment  with normal lumbar lordosis, without scoliosis apparent.   Thoracic spine palpation:  without tenderness of spinal processes   Lumbar spine palpation: without tenderness of lumbar area; without tightness of lumbar muscles    Range of Motion:   Lumbar flexion, forward flexion is normal without pain or tenderness    Lumbar extension is full without pain or tenderness   Left lateral bend is normal without pain or tenderness   Right lateral bend is normal without pain or tenderness   Straight leg raising is normal  Strength & tone: normal   Stability overall normal stability All other systems reviewed and are negative   The patient has been educated about the nature of the problem(s) and counseled on treatment options.  The patient appeared to understand what I have discussed and is in agreement with it.  Encounter Diagnosis  Name Primary?  . Chronic bilateral low back pain with bilateral sciatica Yes    PLAN Call if any problems.  Precautions discussed.  Continue current medications.   Return to clinic 1 month   Electronically Signed Shawn Mclean, MD 10/29/20199:29 AM

## 2018-05-23 ENCOUNTER — Other Ambulatory Visit (HOSPITAL_COMMUNITY): Payer: Self-pay | Admitting: Ophthalmology

## 2018-05-23 DIAGNOSIS — T1512XA Foreign body in conjunctival sac, left eye, initial encounter: Secondary | ICD-10-CM

## 2018-05-25 ENCOUNTER — Ambulatory Visit (HOSPITAL_COMMUNITY)
Admission: RE | Admit: 2018-05-25 | Discharge: 2018-05-25 | Disposition: A | Discharge status: Home / Self Care | Payer: Self-pay | Source: Ambulatory Visit | Attending: Ophthalmology | Admitting: Ophthalmology

## 2018-05-25 DIAGNOSIS — X58XXXA Exposure to other specified factors, initial encounter: Secondary | ICD-10-CM | POA: Insufficient documentation

## 2018-05-25 DIAGNOSIS — T1512XA Foreign body in conjunctival sac, left eye, initial encounter: Secondary | ICD-10-CM | POA: Insufficient documentation

## 2018-06-04 ENCOUNTER — Ambulatory Visit: Payer: Self-pay | Admitting: Physician Assistant

## 2018-06-14 ENCOUNTER — Encounter: Payer: Self-pay | Admitting: Physician Assistant

## 2018-06-14 ENCOUNTER — Encounter: Payer: Self-pay | Admitting: Orthopaedic Surgery

## 2018-06-14 ENCOUNTER — Ambulatory Visit: Payer: Self-pay | Admitting: Physician Assistant

## 2018-06-14 ENCOUNTER — Ambulatory Visit (INDEPENDENT_AMBULATORY_CARE_PROVIDER_SITE_OTHER): Payer: Self-pay | Admitting: Orthopaedic Surgery

## 2018-06-14 VITALS — BP 151/92 | HR 60 | Ht 70.0 in | Wt 240.0 lb

## 2018-06-14 VITALS — BP 126/80 | HR 66 | Temp 97.9°F | Ht 70.0 in | Wt 242.0 lb

## 2018-06-14 DIAGNOSIS — I10 Essential (primary) hypertension: Secondary | ICD-10-CM

## 2018-06-14 DIAGNOSIS — E781 Pure hyperglyceridemia: Secondary | ICD-10-CM

## 2018-06-14 DIAGNOSIS — E669 Obesity, unspecified: Secondary | ICD-10-CM

## 2018-06-14 DIAGNOSIS — M5441 Lumbago with sciatica, right side: Secondary | ICD-10-CM

## 2018-06-14 DIAGNOSIS — M5442 Lumbago with sciatica, left side: Secondary | ICD-10-CM

## 2018-06-14 DIAGNOSIS — G8929 Other chronic pain: Secondary | ICD-10-CM

## 2018-06-14 NOTE — Progress Notes (Signed)
BP 130/82 (BP Location: Left Arm, Patient Position: Sitting, Cuff Size: Normal)   Pulse 66   Temp 97.9 F (36.6 C)   Ht 5\' 10"  (1.778 m)   Wt 242 lb (109.8 kg)   SpO2 98%   BMI 34.72 kg/m    Subjective:    Patient ID: Shawn Pittman, male    DOB: 1966/01/20, 52 y.o.   MRN: [id number]  HPI: Shawn Pittman is a 52 y.o. male presenting on 06/14/2018 for Follow-up   HPI   Pt has seen eye specialist-  He has f/u later today He saw orthopedics earlier today  Pt requests dentist  Relevant past medical, surgical, family and social history reviewed and updated as indicated. Interim medical history since our last visit reviewed. Allergies and medications reviewed and updated.   Current Outpatient Medications:  .  acyclovir (ZOVIRAX) 400 MG tablet, Take 400 mg by mouth 2 (two) times daily., Disp: , Rfl:  .  lisinopril (PRINIVIL,ZESTRIL) 20 MG tablet, Take 2 tablets (40 mg total) by mouth daily., Disp: 60 tablet, Rfl: 3 .  naproxen sodium (ALEVE) 220 MG tablet, Take 440 mg by mouth 2 (two) times daily as needed., Disp: , Rfl:  .  Omega-3 Fatty Acids (FISH OIL PO), Take 1 capsule by mouth daily., Disp: , Rfl:  .  traMADol (ULTRAM) 50 MG tablet, One tablet every six hours as needed for pain., Disp: 50 tablet, Rfl: 2   Review of Systems  Constitutional: Negative for appetite change, chills, diaphoresis, fatigue, fever and unexpected weight change.  HENT: Positive for dental problem and hearing loss. Negative for congestion, drooling, ear pain, facial swelling, mouth sores, sneezing, sore throat, trouble swallowing and voice change.   Eyes: Negative for pain, discharge, redness, itching and visual disturbance.  Respiratory: Negative for cough, choking, shortness of breath and wheezing.   Cardiovascular: Negative for chest pain, palpitations and leg swelling.  Gastrointestinal: Positive for constipation. Negative for abdominal pain, blood in stool, diarrhea and vomiting.  Endocrine:  Negative for cold intolerance, heat intolerance and polydipsia.  Genitourinary: Negative for decreased urine volume, dysuria and hematuria.  Musculoskeletal: Positive for back pain. Negative for arthralgias and gait problem.  Skin: Negative for rash.  Allergic/Immunologic: Negative for environmental allergies.  Neurological: Negative for seizures, syncope, light-headedness and headaches.  Hematological: Negative for adenopathy.  Psychiatric/Behavioral: Negative for agitation, dysphoric mood and suicidal ideas. The patient is not nervous/anxious.     Per HPI unless specifically indicated above     Objective:    BP 130/82 (BP Location: Left Arm, Patient Position: Sitting, Cuff Size: Normal)   Pulse 66   Temp 97.9 F (36.6 C)   Ht 5\' 10"  (1.778 m)   Wt 242 lb (109.8 kg)   SpO2 98%   BMI 34.72 kg/m   Wt Readings from Last 3 Encounters:  06/14/18 242 lb (109.8 kg)  06/14/18 240 lb (108.9 kg)  05/22/18 243 lb (110.2 kg)    Physical Exam  Constitutional: He is oriented to person, place, and time. He appears well-developed and well-nourished.  HENT:  Head: Normocephalic and atraumatic.  Neck: Neck supple.  Cardiovascular: Normal rate and regular rhythm.  Pulmonary/Chest: Effort normal and breath sounds normal. He has no wheezes.  Abdominal: Soft. Bowel sounds are normal. There is no hepatosplenomegaly. There is no tenderness.  Musculoskeletal: He exhibits no edema.  Lymphadenopathy:    He has no cervical adenopathy.  Neurological: He is alert and oriented to person, place, and time.  Skin: Skin is warm and dry.  Psychiatric: He has a normal mood and affect. His behavior is normal.  Vitals reviewed.       Assessment & Plan:    Encounter Diagnoses  Name Primary?  . Essential hypertension Yes  . Hypertriglyceridemia   . Obesity, unspecified classification, unspecified obesity type, unspecified whether serious comorbidity present     -Check lipids labs.  Will call with  results -pt to coninue current medications -Continue with orthopedist and eye specialist per their recommendations -pt to follow up 3 months.  RTO sooner prn

## 2018-06-14 NOTE — Progress Notes (Signed)
Patient Shawn Pittman:Shawn Sanda KleinL Hitt, male DOB:20-Aug-1965, 52 y.o. VWU:[id number]  Chief Complaint  Patient presents with  . Back Pain    HPI  Shawn Pittman is a 52 y.o. male who has chronic lower back pain.  He is stable.  He is doing his exercises and taking his Tramadol.  He has had increased pain the last few days with the colder weather.  He says he just deals with it.  He is active and has no weakness.   Body mass index is 34.44 kg/m.  ROS  Review of Systems  Constitutional: Positive for activity change.  Eyes:       False eye right  Musculoskeletal: Positive for arthralgias and back pain.  All other systems reviewed and are negative.   All other systems reviewed and are negative.  The following is a summary of the past history medically, past history surgically, known current medicines, social history and family history.  This information is gathered electronically by the computer from prior information and documentation.  I review this each visit and have found including this information at this point in the chart is beneficial and informative.    Past Medical History:  Diagnosis Date  . Back pain, chronic   . Hyperlipidemia   . Hypertension   . MRSA (methicillin resistant Staphylococcus aureus)     Past Surgical History:  Procedure Laterality Date  . EYE SURGERY  1989   removed R eye    Family History  Problem Relation Age of Onset  . Heart attack Mother   . Cancer Father     Social History Social History   Tobacco Use  . Smoking status: Never Smoker  . Smokeless tobacco: Never Used  Substance Use Topics  . Alcohol use: Not Currently    Comment: no ETOH since 2000  . Drug use: Not Currently    Types: Marijuana, Cocaine    Comment: none since 1998    No Known Allergies  Current Outpatient Medications  Medication Sig Dispense Refill  . lisinopril (PRINIVIL,ZESTRIL) 20 MG tablet Take 2 tablets (40 mg total) by mouth daily. 60 tablet 3  . naproxen  sodium (ALEVE) 220 MG tablet Take 440 mg by mouth 2 (two) times daily as needed.    . Omega-3 Fatty Acids (FISH OIL PO) Take 1 capsule by mouth daily.    . traMADol (ULTRAM) 50 MG tablet One tablet every six hours as needed for pain. 50 tablet 2   No current facility-administered medications for this visit.      Physical Exam  Blood pressure (!) 151/92, pulse 60, height 5\' 10"  (1.778 m), weight 240 lb (108.9 kg).  Constitutional: overall normal hygiene, normal nutrition, well developed, normal grooming, normal body habitus. Assistive device:none  Musculoskeletal: gait and station Limp none, muscle tone and strength are normal, no tremors or atrophy is present.  .  Neurological: coordination overall normal.  Deep tendon reflex/nerve stretch intact.  Sensation normal.  Cranial nerves II-XII intact.   Skin:   Normal overall no scars, lesions, ulcers or rashes. No psoriasis.  Psychiatric: Alert and oriented x 3.  Recent memory intact, remote memory unclear.  Normal mood and affect. Well groomed.  Good eye contact.  Cardiovascular: overall no swelling, no varicosities, no edema bilaterally, normal temperatures of the legs and arms, no clubbing, cyanosis and good capillary refill.  Lymphatic: palpation is normal.  Spine/Pelvis examination:  Inspection:  Overall, sacoiliac joint benign and hips nontender; without crepitus or defects.   Thoracic  spine inspection: Alignment normal without kyphosis present   Lumbar spine inspection:  Alignment  with normal lumbar lordosis, without scoliosis apparent.   Thoracic spine palpation:  without tenderness of spinal processes   Lumbar spine palpation: without tenderness of lumbar area; without tightness of lumbar muscles    Range of Motion:   Lumbar flexion, forward flexion is normal without pain or tenderness    Lumbar extension is full without pain or tenderness   Left lateral bend is normal without pain or tenderness   Right lateral bend is  normal without pain or tenderness   Straight leg raising is normal  Strength & tone: normal   Stability overall normal stability  All other systems reviewed and are negative   The patient has been educated about the nature of the problem(s) and counseled on treatment options.  The patient appeared to understand what I have discussed and is in agreement with it.  Encounter Diagnosis  Name Primary?  . Chronic bilateral low back pain with bilateral sciatica Yes    PLAN Call if any problems.  Precautions discussed.  Continue current medications.   Return to clinic 2 months   Electronically Signed Darreld Mclean, MD 11/21/20199:46 AM

## 2018-06-18 ENCOUNTER — Other Ambulatory Visit (HOSPITAL_COMMUNITY)
Admission: RE | Admit: 2018-06-18 | Discharge: 2018-06-18 | Disposition: A | Discharge status: Home / Self Care | Payer: Self-pay | Source: Ambulatory Visit | Attending: Physician Assistant | Admitting: Physician Assistant

## 2018-06-18 DIAGNOSIS — E781 Pure hyperglyceridemia: Secondary | ICD-10-CM | POA: Insufficient documentation

## 2018-06-18 DIAGNOSIS — I10 Essential (primary) hypertension: Secondary | ICD-10-CM | POA: Insufficient documentation

## 2018-06-18 LAB — COMPREHENSIVE METABOLIC PANEL
ALK PHOS: 51 U/L (ref 38–126)
ALT: 25 U/L (ref 0–44)
ANION GAP: 8 (ref 5–15)
AST: 22 U/L (ref 15–41)
Albumin: 4.6 g/dL (ref 3.5–5.0)
BILIRUBIN TOTAL: 1 mg/dL (ref 0.3–1.2)
BUN: 14 mg/dL (ref 6–20)
CALCIUM: 8.9 mg/dL (ref 8.9–10.3)
CO2: 26 mmol/L (ref 22–32)
Chloride: 103 mmol/L (ref 98–111)
Creatinine, Ser: 0.86 mg/dL (ref 0.61–1.24)
GLUCOSE: 105 mg/dL — AB (ref 70–99)
Potassium: 4.1 mmol/L (ref 3.5–5.1)
Sodium: 137 mmol/L (ref 135–145)
TOTAL PROTEIN: 7.6 g/dL (ref 6.5–8.1)

## 2018-06-18 LAB — LIPID PANEL
Cholesterol: 197 mg/dL (ref 0–200)
HDL: 39 mg/dL — AB (ref >40)
LDL Cholesterol: 112 mg/dL — ABNORMAL HIGH (ref 0–99)
TRIGLYCERIDES: 232 mg/dL — AB (ref <150)
Total CHOL/HDL Ratio: 5.1 RATIO
VLDL: 46 mg/dL — ABNORMAL HIGH (ref 0–40)

## 2018-07-06 ENCOUNTER — Other Ambulatory Visit: Payer: Self-pay | Admitting: Orthopaedic Surgery

## 2018-07-09 ENCOUNTER — Encounter: Payer: Self-pay | Admitting: Orthopaedic Surgery

## 2018-07-23 ENCOUNTER — Telehealth: Payer: Self-pay | Admitting: Student

## 2018-07-23 NOTE — Telephone Encounter (Signed)
Pt called regarding rib pain from rib fracture that occurred 3 weeks ago. Pt states pain was getting better until he held in a sneeze about a week ago (approx. 07-16-18) pt states pain has worsened and now feels like his "muscle is being pinched between his bones". Pt states he has been taking diclofenac, ultram, and metaxalone. Pt asks if there is anything else he can take for pain.  Appointment was offered to pt for him to be seen on 07-23-18. Pt refused appointment.  Pt was scheduled for 07-26-18

## 2018-07-26 ENCOUNTER — Ambulatory Visit: Payer: Self-pay | Admitting: Physician Assistant

## 2018-08-01 ENCOUNTER — Encounter: Payer: Self-pay | Admitting: Physician Assistant

## 2018-08-01 ENCOUNTER — Ambulatory Visit (HOSPITAL_COMMUNITY)
Admission: RE | Admit: 2018-08-01 | Discharge: 2018-08-01 | Disposition: A | Discharge status: Home / Self Care | Payer: Self-pay | Source: Ambulatory Visit | Attending: Physician Assistant | Admitting: Physician Assistant

## 2018-08-01 ENCOUNTER — Ambulatory Visit: Payer: Self-pay | Admitting: Physician Assistant

## 2018-08-01 VITALS — BP 133/86 | HR 68 | Temp 97.3°F | Ht 70.0 in | Wt 235.0 lb

## 2018-08-01 DIAGNOSIS — R0781 Pleurodynia: Secondary | ICD-10-CM

## 2018-08-01 DIAGNOSIS — S2249XG Multiple fractures of ribs, unspecified side, subsequent encounter for fracture with delayed healing: Secondary | ICD-10-CM | POA: Insufficient documentation

## 2018-08-01 DIAGNOSIS — I10 Essential (primary) hypertension: Secondary | ICD-10-CM

## 2018-08-01 DIAGNOSIS — I712 Thoracic aortic aneurysm, without rupture, unspecified: Secondary | ICD-10-CM

## 2018-08-01 DIAGNOSIS — E669 Obesity, unspecified: Secondary | ICD-10-CM

## 2018-08-01 DIAGNOSIS — Z91199 Patient's noncompliance with other medical treatment and regimen due to unspecified reason: Secondary | ICD-10-CM

## 2018-08-01 DIAGNOSIS — Z9119 Patient's noncompliance with other medical treatment and regimen: Secondary | ICD-10-CM

## 2018-08-01 DIAGNOSIS — E781 Pure hyperglyceridemia: Secondary | ICD-10-CM

## 2018-08-01 NOTE — Patient Instructions (Signed)
Thoracic Aortic Aneurysm    An aneurysm is a bulge in an artery. It happens when blood pushes up against a weakened or damaged artery wall. A thoracic aortic aneurysm is an aneurysm that occurs in the first part of the aorta, between the heart and the diaphragm. The aorta is the main artery of the body. It supplies blood from the heart to the rest of the body.  Some aneurysms may not cause symptoms or problems. However, the major concern with a thoracic aortic aneurysm is that it can enlarge and burst (rupture), or blood can flow between the layers of the wall of the aorta through a tear (aorticdissection). Both of these conditions can cause bleeding inside the body and can be life threatening if they are not diagnosed and treated right away.  What are the causes?  The exact cause of this condition is not known.  What increases the risk?  The following factors may make you more likely to develop this condition:   Being 65 or older.   Having hardening of the arteries caused by the buildup of fat and other substances in the lining of a blood vessel (arteriosclerosis).   Having inflammation of the walls of an artery (arteritis).   Having a genetic disease that weakens the body's connective tissue, such as Marfan syndrome.   Having a family history of aneurysms.   Having an injury or trauma to the aorta.   Using tobacco.   Having high blood pressure (hypertension).   Having high cholesterol.   Having an infection that is caused by bacteria, such as syphilis or staphylococcus, in the wall of the aorta (infectious aortitis).  What are the signs or symptoms?  Symptoms of this condition vary depending on the size of the aneurysm and how fast it is growing. Most grow slowly and do not cause symptoms. When symptoms do occur, they may include:   Pain in the chest, back, sides, or abdomen. The pain may vary in intensity. Sudden, severe pain may indicate that the aneurysm has ruptured.   Hoarseness.   Cough.    Shortness of breath.   Swallowing problems.   Swelling in the face, arms, or legs.   Fever.   Unexplained weight loss.  How is this diagnosed?  This condition may be diagnosed with:   An ultrasound.   X-rays.   A CT scan.   An MRI.   Tests to check the arteries for damage or blockages (angiogram).  Most unruptured thoracic aortic aneurysms cause no symptoms, so they are often found during exams for other conditions.  How is this treated?  Treatment for this condition depends on:   The size of the aneurysm.   How fast the aneurysm is growing.   Your age.   Risk factors for rupture.  Small aneurysms (2.2 inches, or 5.5 cm, or less) may be managed with:   Medicines to:  ? Control blood pressure.  ? Manage pain.  ? Fight infection.   Regular monitoring. This may include an ultrasound or CT scan every year or every 6 months to see if the aneurysm is getting bigger.  Large or fast-growing aneurysms may be treated with surgery.  Follow these instructions at home:  Eating and drinking   Eat a healthy diet. Your health care provider may recommend that you:  ? Lower your salt (sodium) intake. In some people, too much salt can raise blood pressure and increase the risk for thoracic aortic aneurysm.  ? Avoid foods   that are high in saturated fat and cholesterol, such as red meat and dairy.  ? Eat a diet that is low in sugar.  ? Increase your fiber intake by including whole grains, vegetables, and fruits in your diet. Eating these foods may help to lower your blood pressure.   Limit or avoid alcohol as recommended by your health care provider.  Lifestyle   Do not use any products that contain nicotine or tobacco, such as cigarettes and e-cigarettes. If you need help quitting, ask your health care provider.   Maintain a healthy weight.   Check your blood pressure regularly. Follow your health care provider's instructions on how to keep your blood pressure within normal limits.   Have your blood sugar (glucose)  level and cholesterol levels checked regularly. Follow your health care provider's instructions on how to keep levels within normal limits.  Activity     Stay physically active and exercise regularly. Talk with your health care provider about how often you should exercise and ask which types of exercise are best for you.   Avoid heavy lifting and activities that take a lot of effort. Ask your health care provider what activities are safe for you.  General instructions   Take over-the-counter and prescription medicines only as told by your health care provider.   Talk with your health care provider about regular screenings to see if the aneurysm is getting bigger.   Keep all follow-up visits as told by your health care provider. This is important.  Contact a health care provider if you have:   Unexplained weight loss.  Get help right away if you have:   Pain in your upper back, neck, or abdomen. This pain may move into your chest and arms.   Trouble swallowing.   A cough or hoarseness.   Shortness of breath.  Summary   A thoracic aortic aneurysm is an aneurysm that occurs in the first part of the aorta, between the heart and the diaphragm.   As a thoracic aortic aneurysm becomes larger, it can burst (rupture), or blood can flow between the layers of the wall of the aorta through a tear (aorticdissection). These conditions can be life threatening if they are not diagnosed and treated right away.   If you have a thoracic aortic aneurysm, its growth will be closely monitored. Surgical repair may be needed for larger or faster-growing aneurysms.  This information is not intended to replace advice given to you by your health care provider. Make sure you discuss any questions you have with your health care provider.  Document Released: 07/11/2005 Document Revised: 08/22/2017 Document Reviewed: 08/22/2017  [employer] Interactive Patient Education  2019 [employer]

## 2018-08-01 NOTE — Progress Notes (Signed)
BP 133/86 (BP Location: Right Arm, Patient Position: Sitting, Cuff Size: Large)   Pulse 68   Temp (!) 97.3 F (36.3 C) (Other (Comment))   Ht 5\' 10"  (1.778 m)   Wt 235 lb (106.6 kg)   SpO2 98%   BMI 33.72 kg/m    Subjective:    Patient ID: Shawn Pittman L Bendickson, male    DOB: [birth year], 53 y.o.   MRN: [id number]  HPI: Shawn PeerDonald L Monterrosa is a 53 y.o. male presenting on 08/01/2018 for Follow[facility]up [facility] after [place])   HPI   Chief Complaint  Patient presents with  . Follow-up    from [facility] 07-09-18 after fell [place] deck [place]     Pt concerned about aneurysm that was incedentally found on CT while at ER on 07/09/18.  Pt c/o pain from ribs.  he [place] SpanielSaid it felt like it was improving until he sneezed one time and now it is worse.  No sob.    Pt states that he currently only takes his BP medication some of the time.  Not even regularly enough to call it every other day even.  Relevant past medical, surgical, family and social history reviewed and updated as indicated. Interim medical history since our last visit reviewed. Allergies and medications reviewed and updated.   Current Outpatient Medications:  .  lisinopril (PRINIVIL,ZESTRIL) 20 MG tablet, Take 2 tablets (40 mg total) by mouth daily., Disp: 60 tablet, Rfl: 3 .  naproxen sodium (ALEVE) 220 MG tablet, Take 440 mg by mouth 2 (two) times daily as needed., Disp: , Rfl:  .  Omega-3 Fatty Acids (FISH OIL PO), Take 1 capsule by mouth daily., Disp: , Rfl:  .  traMADol (ULTRAM) 50 MG tablet, TAKE 1 TABLET BY MOUTH EVERY 6 HOURS AS NEEDED FOR PAIN, Disp: 50 tablet, Rfl: 2 .  acyclovir (ZOVIRAX) 400 MG tablet, Take 400 mg by mouth 2 (two) times daily., Disp: , Rfl:     Review of Systems  Constitutional: Negative for appetite change, chills, diaphoresis, fatigue, fever and unexpected weight change.  HENT: Positive for dental problem and hearing loss. Negative for  congestion, drooling, ear pain, facial swelling, mouth sores, sneezing, sore throat, trouble swallowing and voice change.   Eyes: Negative for pain, discharge, redness, itching and visual disturbance.  Respiratory: Negative for cough, choking, shortness of breath and wheezing.   Cardiovascular: Negative for chest pain, palpitations and leg swelling.  Gastrointestinal: Positive for constipation. Negative for abdominal pain, blood in stool, diarrhea and vomiting.  Endocrine: Negative for cold intolerance, heat intolerance and polydipsia.  Genitourinary: Negative for decreased urine volume, dysuria and hematuria.  Musculoskeletal: Positive for arthralgias and back pain. Negative for gait problem.  Skin: Negative for rash.  Allergic/Immunologic: Negative for environmental allergies.  Neurological: Negative for seizures, syncope, light-headedness and headaches.  Hematological: Negative for adenopathy.  Psychiatric/Behavioral: Negative for agitation, dysphoric mood and suicidal ideas. The patient is not nervous/anxious.     Per HPI unless specifically indicated above     Objective:    BP 133/86 (BP Location: Right Arm, Patient Position: Sitting, Cuff Size: Large)   Pulse 68   Temp (!) 97.3 F (36.3 C) (Other (Comment))   Ht 5\' 10"  (1.778 m)   Wt 235 lb (106.6 kg)   SpO2 98%   BMI 33.72 kg/m   Wt Readings from Last 3 Encounters:  08/01/18 235 lb (106.6 kg)  06/14/18 242 lb (109.8 kg)  06/14/18 240 lb (108.9 kg)    Physical Exam Vitals signs reviewed.  Constitutional:      Appearance: He is well-developed. He is obese.  HENT:     Head: Normocephalic and atraumatic.  Neck:     Musculoskeletal: Neck supple.  Cardiovascular:     Rate and Rhythm: Normal rate and regular rhythm.  Pulmonary:     Effort: Pulmonary effort is normal. No respiratory distress.     Breath sounds: Normal breath sounds. No stridor. No wheezing, rhonchi or rales.  Chest:     Chest wall: Tenderness present.      Comments: + tenderness R chest wall anteriorly and laterally.  No single  point tenderness.  No crepitus appreciated.  Abdominal:     General: Bowel sounds are normal.     Palpations: Abdomen is soft.     Tenderness: There is no abdominal tenderness.  Lymphadenopathy:     Cervical: No cervical adenopathy.  Skin:    General: Skin is warm and dry.  Neurological:     Mental Status: He is alert and oriented to person, place, and time.  Psychiatric:        Behavior: Behavior normal.         Assessment & Plan:   Encounter Diagnoses  Name Primary?  . Essential hypertension Yes  . Rib pain   . Closed fracture of multiple ribs with delayed healing, unspecified laterality, subsequent encounter   . Thoracic aortic aneurysm without rupture (HCC)   . Hypertriglyceridemia   . Obesity, unspecified classification, unspecified obesity type, unspecified whether serious comorbidity present   . Personal history of noncompliance with medical treatment, presenting hazards to health     -counseled pt on aneurysm which currently measures 4.2cm.  Will recheck December 2020 which is in accordance with 2010 ACCF/AHA/AATS/ACR/ASA/SCA/SCAI/SIR/STS/SVM Guidelines for the Diagnosis and Management of Patients With Thoracic Aortic Disease .  Gave reading information to the pt.  Counseled pt on the need to take his bp medication EVERY DAY.  Pt is aware that he will need to be re-imaged in December  -recheck CXR today  -pt to follow up as scheduled.  RTO sooner prn SOB or other problems

## 2018-08-02 DIAGNOSIS — I712 Thoracic aortic aneurysm, without rupture, unspecified: Secondary | ICD-10-CM | POA: Insufficient documentation

## 2018-08-09 ENCOUNTER — Encounter: Payer: Self-pay | Admitting: Physician Assistant

## 2018-08-14 ENCOUNTER — Encounter: Payer: Self-pay | Admitting: Orthopaedic Surgery

## 2018-08-14 ENCOUNTER — Ambulatory Visit (INDEPENDENT_AMBULATORY_CARE_PROVIDER_SITE_OTHER): Payer: Self-pay | Admitting: Orthopaedic Surgery

## 2018-08-14 VITALS — BP 147/94 | HR 76 | Ht 70.0 in | Wt 241.0 lb

## 2018-08-14 DIAGNOSIS — G8929 Other chronic pain: Secondary | ICD-10-CM

## 2018-08-14 DIAGNOSIS — I712 Thoracic aortic aneurysm, without rupture, unspecified: Secondary | ICD-10-CM

## 2018-08-14 DIAGNOSIS — M5442 Lumbago with sciatica, left side: Secondary | ICD-10-CM

## 2018-08-14 DIAGNOSIS — M5441 Lumbago with sciatica, right side: Secondary | ICD-10-CM

## 2018-08-14 MED ORDER — TRAMADOL HCL 50 MG PO TABS: ORAL_TABLET | ORAL | 3 refills | Status: DC

## 2018-08-14 NOTE — Progress Notes (Signed)
Patient Shawn Pittman, male DOB:July 14, 1966, 53 y.o. TRV:[id number]  Chief Complaint  Patient presents with  . Back Pain    increased pain since 07/09/18 fall     HPI  Shawn Pittman is a 53 y.o. male who has lower back pain and he had a new injury on 07-09-18 and hurt his back and right ribs.  He had rib fractures on the right.  In the work-up it was discovered he has thoracic aneurysm.  His rib pain is better now.  He would like to see a cardiothoracic surgeon about his aneurysm and I will make the appointment.  His back pain is stable but hurts more with cold weather and with activity.  He is taking his medicine and doing his exercises.   Body mass index is 34.58 kg/m.  ROS  Review of Systems  Constitutional: Positive for activity change.  Eyes:       False eye right  Musculoskeletal: Positive for arthralgias and back pain.  All other systems reviewed and are negative.   All other systems reviewed and are negative.  The following is a summary of the past history medically, past history surgically, known current medicines, social history and family history.  This information is gathered electronically by the computer from prior information and documentation.  I review this each visit and have found including this information at this point in the chart is beneficial and informative.    Past Medical History:  Diagnosis Date  . Back pain, chronic   . Hyperlipidemia   . Hypertension   . MRSA (methicillin resistant Staphylococcus aureus)     Past Surgical History:  Procedure Laterality Date  . EYE SURGERY  1989   removed R eye    Family History  Problem Relation Age of Onset  . Heart attack Mother   . Cancer Father     Social History Social History   Tobacco Use  . Smoking status: Never Smoker  . Smokeless tobacco: Never Used  Substance Use Topics  . Alcohol use: Not Currently    Comment: no ETOH since 2000  . Drug use: Not Currently    Types: Marijuana,  Cocaine    Comment: none since 1998    No Known Allergies  Current Outpatient Medications  Medication Sig Dispense Refill  . acyclovir (ZOVIRAX) 400 MG tablet Take 400 mg by mouth 2 (two) times daily.    Marland Kitchen lisinopril (PRINIVIL,ZESTRIL) 20 MG tablet Take 2 tablets (40 mg total) by mouth daily. 60 tablet 3  . naproxen sodium (ALEVE) 220 MG tablet Take 440 mg by mouth 2 (two) times daily as needed.    . Omega-3 Fatty Acids (FISH OIL PO) Take 1 capsule by mouth daily.    . traMADol (ULTRAM) 50 MG tablet TAKE 1 TABLET BY MOUTH EVERY 6 HOURS AS NEEDED FOR PAIN 60 tablet 3   No current facility-administered medications for this visit.      Physical Exam  Blood pressure (!) 147/94, pulse 76, height 5\' 10"  (1.778 m), weight 241 lb (109.3 kg).  Constitutional: overall normal hygiene, normal nutrition, well developed, normal grooming, normal body habitus. Assistive device:none  Musculoskeletal: gait and station Limp none, muscle tone and strength are normal, no tremors or atrophy is present.  .  Neurological: coordination overall normal.  Deep tendon reflex/nerve stretch intact.  Sensation normal.  Cranial nerves II-XII intact.   Skin:   Normal overall no scars, lesions, ulcers or rashes. No psoriasis.  Psychiatric: Alert and oriented  x 3.  Recent memory intact, remote memory unclear.  Normal mood and affect. Well groomed.  Good eye contact.  Cardiovascular: overall no swelling, no varicosities, no edema bilaterally, normal temperatures of the legs and arms, no clubbing, cyanosis and good capillary refill.  Lymphatic: palpation is normal.  Right rib cage is slightly tender.  Spine/Pelvis examination:  Inspection:  Overall, sacoiliac joint benign and hips nontender; without crepitus or defects.   Thoracic spine inspection: Alignment normal without kyphosis present   Lumbar spine inspection:  Alignment  with normal lumbar lordosis, without scoliosis apparent.   Thoracic spine  palpation:  without tenderness of spinal processes   Lumbar spine palpation: without tenderness of lumbar area; without tightness of lumbar muscles    Range of Motion:   Lumbar flexion, forward flexion is normal without pain or tenderness    Lumbar extension is full without pain or tenderness   Left lateral bend is normal without pain or tenderness   Right lateral bend is normal without pain or tenderness   Straight leg raising is normal  Strength & tone: normal   Stability overall normal stability  All other systems reviewed and are negative   The patient has been educated about the nature of the problem(s) and counseled on treatment options.  The patient appeared to understand what I have discussed and is in agreement with it.  Encounter Diagnoses  Name Primary?  . Chronic bilateral low back pain with bilateral sciatica   . Thoracic aortic aneurysm without rupture (HCC) Yes    PLAN Call if any problems.  Precautions discussed.  Continue current medications.   Return to clinic 3 months   I have reviewed the [facility] Controlled Substance Reporting System web site prior to prescribing narcotic medicine for this patient.    To see thoracic surgeon.  Electronically Signed Darreld Mclean, MD 1/21/20203:10 PM

## 2018-08-24 ENCOUNTER — Other Ambulatory Visit: Payer: Self-pay | Admitting: Orthopaedic Surgery

## 2018-08-24 NOTE — Telephone Encounter (Signed)
Patient also called, regarding refill for medication: traMADol (ULTRAM) 50 MG tablet 60 tablet 3   - medication indicates has refills; patient states pharmacy shows "discontinued"   - please advise.

## 2018-09-12 ENCOUNTER — Encounter: Payer: Self-pay | Admitting: Surgery

## 2018-09-20 ENCOUNTER — Ambulatory Visit: Payer: Self-pay | Admitting: Physician Assistant

## 2018-09-26 ENCOUNTER — Other Ambulatory Visit: Payer: Self-pay

## 2018-09-26 ENCOUNTER — Encounter: Payer: Self-pay | Admitting: Surgery

## 2018-09-26 ENCOUNTER — Institutional Professional Consult (permissible substitution) (INDEPENDENT_AMBULATORY_CARE_PROVIDER_SITE_OTHER): Payer: No Typology Code available for payment source | Admitting: Surgery

## 2018-09-26 VITALS — BP 144/90 | HR 68 | Resp 16 | Ht 70.0 in | Wt 240.0 lb

## 2018-09-26 DIAGNOSIS — I712 Thoracic aortic aneurysm, without rupture, unspecified: Secondary | ICD-10-CM

## 2018-09-26 NOTE — Progress Notes (Signed)
Cardiothoracic Surgery Consultation   PCP is Jacquelin Hawking, PA-C Referring Provider is Darreld Mclean, MD  Chief Complaint  Patient presents with  . Thoracic Aortic Aneurysm    Surgical eval..Marland KitchenCT C/A/P 07/09/18- in PAC's...NO ECHO FOUND    HPI:  The patient is a 53 year old gentleman with history of hypertension and hyperlipidemia who fell approximately 5 feet off a ladder on 07/09/2018 striking the right side of his chest and his head.  Patient apparently had brief loss of consciousness.  He was worked up in the emergency department at [facility] and had a CT scan of the chest which showed an incidental 4.2 cm fusiform ascending aortic aneurysm without aortic dissection.  There are also nondisplaced fractures of the posterior right fourth, seventh, and eighth ribs.  There is no pericardial or pleural effusion.  CT scan of the head was negative.  The patient made an uneventful recovery from that but still has some chest discomfort related to his rib fractures.  He said that his orthopedic surgeon felt that he should be evaluated by thoracic surgery concerning the aneurysm.  He has no family history of aortic aneurysm or aortic dissection.  There is no history of connective tissue disorder.  The patient is here today with his fiance.  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  Past Medical History:  Diagnosis Date  . Back pain, chronic   . Hyperlipidemia   . Hypertension   . MRSA (methicillin resistant Staphylococcus aureus)     Past Surgical History:  Procedure Laterality Date  . EYE SURGERY  1989   removed R eye    Family History  Problem Relation Age of Onset  . Heart attack Mother   . Cancer Father     Social History Social History   Tobacco Use  . Smoking status: Never Smoker  . Smokeless tobacco: Never Used  Substance Use Topics  . Alcohol use: Not Currently    Comment: no ETOH since 2000  . Drug use: Not  Currently    Types: Marijuana, Cocaine    Comment: none since 1998    Current Outpatient Medications  Medication Sig Dispense Refill  . lisinopril (PRINIVIL,ZESTRIL) 20 MG tablet Take 2 tablets (40 mg total) by mouth daily. 60 tablet 3  . Omega-3 Fatty Acids (FISH OIL PO) Take 1 capsule by mouth daily.    . traMADol (ULTRAM) 50 MG tablet TAKE 1 TABLET BY MOUTH EVERY 6 HOURS AS NEEDED FOR PAIN 50 tablet 3   No current facility-administered medications for this visit.     No Known Allergies  Review of Systems  Constitutional: Negative for activity change and fatigue.  HENT: Negative.   Eyes:       Floaters  Respiratory: Negative for chest tightness and shortness of breath.   Cardiovascular: Negative for chest pain, palpitations and leg swelling.  Gastrointestinal: Negative.   Endocrine: Negative.   Genitourinary: Negative.   Musculoskeletal: Positive for arthralgias, gait problem, joint swelling and myalgias.  Skin: Negative.   Neurological: Negative for dizziness, syncope and headaches.  Hematological: Negative.   Psychiatric/Behavioral: Negative.     BP (!) 144/90 (BP Location: Right Arm, Patient Position: Sitting, Cuff Size: Small) Comment (Cuff Size): MANUALLY  Pulse 68   Resp 16   Ht 5\' 10"  (1.778 m)   Wt 240 lb (108.9 kg)   SpO2 95% Comment: RA  BMI 34.44 kg/m  Physical Exam Constitutional:  Appearance: Normal appearance. He is normal weight.  HENT:     Head: Normocephalic and atraumatic.     Mouth/Throat:     Mouth: Mucous membranes are moist.     Pharynx: Oropharynx is clear.  Eyes:     Extraocular Movements: Extraocular movements intact.     Conjunctiva/sclera: Conjunctivae normal.     Pupils: Pupils are equal, round, and reactive to light.  Neck:     Musculoskeletal: Normal range of motion and neck supple.     Vascular: No carotid bruit.  Cardiovascular:     Rate and Rhythm: Normal rate and regular rhythm.     Pulses: Normal pulses.     Heart  sounds: Normal heart sounds. No murmur.  Pulmonary:     Effort: Pulmonary effort is normal.     Breath sounds: Normal breath sounds.  Abdominal:     General: Abdomen is flat. Bowel sounds are normal.     Palpations: Abdomen is soft.  Musculoskeletal:        General: No swelling.  Lymphadenopathy:     Cervical: No cervical adenopathy.  Skin:    General: Skin is warm and dry.  Neurological:     General: No focal deficit present.     Mental Status: He is alert and oriented to person, place, and time.  Psychiatric:        Mood and Affect: Mood normal.        Behavior: Behavior normal.        Thought Content: Thought content normal.        Judgment: Judgment normal.      Diagnostic Tests:  CTA of the chest, abdomen, and pelvis dated 07/09/2018 at [facility] was reviewed on the PACS system  Impression:  This 53 year old gentleman has an incidental 4.2 cm fusiform ascending aortic aneurysm of unknown duration.  This is well below the 5.5 cm surgical threshold.  He does not have any heart murmur to suggest a bicuspid aortic valve.  I reviewed the CTA images with the patient and his fiance and answered their questions.  I stressed the importance of good blood pressure control and preventing further enlargement and aortic dissection.  He is currently on lisinopril but voiced concerns that it may be causing him some impotence and I suspect that he may not be taking it regularly due to that.  I strongly advised him to take his blood pressure medication regularly and avoid abrupt withdrawal.  His blood pressure is mildly elevated today and told him I thought he should get a blood pressure cuff and check it at home periodically.  If he has continued problems with impotence then it may be necessary to try different blood pressure medication.  I have recommended that he have a follow-up scan in 1 year.  This can either be followed up by his primary physician or I will be happy to see  him back at that time to review the scan and answer any questions that he has.   Plan:  The patient should have a follow-up CTA of the chest in 1 year.  He will either have his PCP follow-up on that or I will be happy to follow him here and arrange for the CT scan prior to his visit.  I spent 45 minutes performing this consultation and > 50% of this time was spent face to face counseling and coordinating the care of this patient's ascending aortic aneurysm.   Alleen Borne, MD [facility] [phone]

## 2018-09-27 ENCOUNTER — Encounter: Payer: Self-pay | Admitting: Orthopaedic Surgery

## 2018-09-27 ENCOUNTER — Ambulatory Visit (INDEPENDENT_AMBULATORY_CARE_PROVIDER_SITE_OTHER): Payer: Self-pay | Admitting: Orthopaedic Surgery

## 2018-09-27 VITALS — BP 142/87 | HR 59 | Ht 70.0 in | Wt 230.0 lb

## 2018-09-27 DIAGNOSIS — M5441 Lumbago with sciatica, right side: Secondary | ICD-10-CM

## 2018-09-27 DIAGNOSIS — M5442 Lumbago with sciatica, left side: Secondary | ICD-10-CM

## 2018-09-27 DIAGNOSIS — G8929 Other chronic pain: Secondary | ICD-10-CM

## 2018-09-27 NOTE — Progress Notes (Signed)
Patient UY:EBXIDH Shawn Pittman, male DOB:November 03, 1965, 53 y.o. [id number]  Chief Complaint  Patient presents with  . Back Pain    HPI  Shawn Pittman is a 53 y.o. male who has chronic lower back pain.  He has more pain with the cold weather we have had. He has no weakness, no numbness.  He is doing his exercises and taking his medicine.  He saw the thoracic surgeon about his aortic aneurysm yesterday. I have reviewed the notes.   Body mass index is 33 kg/m.  ROS  Review of Systems  Constitutional: Positive for activity change.  Eyes:       False eye right  Musculoskeletal: Positive for arthralgias and back pain.  All other systems reviewed and are negative.   All other systems reviewed and are negative.  The following is a summary of the past history medically, past history surgically, known current medicines, social history and family history.  This information is gathered electronically by the computer from prior information and documentation.  I review this each visit and have found including this information at this point in the chart is beneficial and informative.    Past Medical History:  Diagnosis Date  . Back pain, chronic   . Hyperlipidemia   . Hypertension   . MRSA (methicillin resistant Staphylococcus aureus)     Past Surgical History:  Procedure Laterality Date  . EYE SURGERY  1989   removed R eye    Family History  Problem Relation Age of Onset  . Heart attack Mother   . Cancer Father     Social History Social History   Tobacco Use  . Smoking status: Never Smoker  . Smokeless tobacco: Never Used  Substance Use Topics  . Alcohol use: Not Currently    Comment: no ETOH since 2000  . Drug use: Not Currently    Types: Marijuana, Cocaine    Comment: none since 1998    No Known Allergies  Current Outpatient Medications  Medication Sig Dispense Refill  . lisinopril (PRINIVIL,ZESTRIL) 20 MG tablet Take 2 tablets (40 mg total) by mouth daily. 60  tablet 3  . Omega-3 Fatty Acids (FISH OIL PO) Take 1 capsule by mouth daily.    . traMADol (ULTRAM) 50 MG tablet TAKE 1 TABLET BY MOUTH EVERY 6 HOURS AS NEEDED FOR PAIN 50 tablet 3   No current facility-administered medications for this visit.      Physical Exam  Blood pressure (!) 142/87, pulse (!) 59, height 5\' 10"  (1.778 m), weight 230 lb (104.3 kg).  Constitutional: overall normal hygiene, normal nutrition, well developed, normal grooming, normal body habitus. Assistive device:none  Musculoskeletal: gait and station Limp none, muscle tone and strength are normal, no tremors or atrophy is present.  .  Neurological: coordination overall normal.  Deep tendon reflex/nerve stretch intact.  Sensation normal.  Cranial nerves II-XII intact.   Skin:   Normal overall no scars, lesions, ulcers or rashes. No psoriasis.  Psychiatric: Alert and oriented x 3.  Recent memory intact, remote memory unclear.  Normal mood and affect. Well groomed.  Good eye contact.  Cardiovascular: overall no swelling, no varicosities, no edema bilaterally, normal temperatures of the legs and arms, no clubbing, cyanosis and good capillary refill.  Lymphatic: palpation is normal.  Spine/Pelvis examination:  Inspection:  Overall, sacoiliac joint benign and hips nontender; without crepitus or defects.   Thoracic spine inspection: Alignment normal without kyphosis present   Lumbar spine inspection:  Alignment  with normal  lumbar lordosis, without scoliosis apparent.   Thoracic spine palpation:  without tenderness of spinal processes   Lumbar spine palpation: without tenderness of lumbar area; without tightness of lumbar muscles    Range of Motion:   Lumbar flexion, forward flexion is normal without pain or tenderness    Lumbar extension is full without pain or tenderness   Left lateral bend is normal without pain or tenderness   Right lateral bend is normal without pain or tenderness   Straight leg raising is  normal  Strength & tone: normal   Stability overall normal stability  All other systems reviewed and are negative   The patient has been educated about the nature of the problem(s) and counseled on treatment options.  The patient appeared to understand what I have discussed and is in agreement with it.  Encounter Diagnosis  Name Primary?  . Chronic bilateral low back pain with bilateral sciatica Yes    PLAN Call if any problems.  Precautions discussed.  Continue current medications.   Return to clinic 3 months   Electronically Signed Darreld Mclean, MD 3/5/20209:26 AM

## 2018-10-01 ENCOUNTER — Encounter: Payer: Self-pay | Admitting: *Deleted

## 2018-10-03 ENCOUNTER — Other Ambulatory Visit: Payer: Self-pay | Admitting: Physician Assistant

## 2018-10-03 ENCOUNTER — Ambulatory Visit: Payer: Self-pay | Admitting: Physician Assistant

## 2018-10-03 ENCOUNTER — Encounter: Payer: Self-pay | Admitting: Physician Assistant

## 2018-10-03 VITALS — BP 129/75 | HR 61 | Temp 97.7°F | Ht 70.0 in | Wt 237.0 lb

## 2018-10-03 DIAGNOSIS — I712 Thoracic aortic aneurysm, without rupture, unspecified: Secondary | ICD-10-CM

## 2018-10-03 DIAGNOSIS — Z1211 Encounter for screening for malignant neoplasm of colon: Secondary | ICD-10-CM

## 2018-10-03 DIAGNOSIS — I10 Essential (primary) hypertension: Secondary | ICD-10-CM

## 2018-10-03 DIAGNOSIS — E785 Hyperlipidemia, unspecified: Secondary | ICD-10-CM

## 2018-10-03 DIAGNOSIS — E669 Obesity, unspecified: Secondary | ICD-10-CM

## 2018-10-03 NOTE — Patient Instructions (Signed)

## 2018-10-03 NOTE — Progress Notes (Signed)
BP 129/75 (BP Location: Right Arm, Patient Position: Sitting, Cuff Size: Normal)   Pulse 61   Temp 97.7 F (36.5 C)   Ht 5\' 10"  (1.778 m)   Wt 237 lb (107.5 kg)   SpO2 97%   BMI 34.01 kg/m    Subjective:    Patient ID: Shawn Pittman, male    DOB: 06-01-1966, 53 y.o.   MRN: [id number]  HPI: Shawn Pittman is a 53 y.o. male presenting on 10/03/2018 for Follow-up   HPI   Pt is not following lowfat diet but says he is walking for exercise more now.    Pt got the orthopedist to refer him to cardiothoracic surgeon for his aneurysm and was told same thing he was told here- 1 year follow up imaging is recommended.    Pt has no new complaints today  Relevant past medical, surgical, family and social history reviewed and updated as indicated. Interim medical history since our last visit reviewed. Allergies and medications reviewed and updated.   Current Outpatient Medications:  .  lisinopril (PRINIVIL,ZESTRIL) 20 MG tablet, Take 2 tablets (40 mg total) by mouth daily. (Patient taking differently: Take 20 mg by mouth daily. ), Disp: 60 tablet, Rfl: 3 .  Omega-3 Fatty Acids (FISH OIL PO), Take 1 capsule by mouth every other day. , Disp: , Rfl:  .  traMADol (ULTRAM) 50 MG tablet, TAKE 1 TABLET BY MOUTH EVERY 6 HOURS AS NEEDED FOR PAIN, Disp: 50 tablet, Rfl: 3    Review of Systems  Constitutional: Negative for appetite change, chills, diaphoresis, fatigue, fever and unexpected weight change.  HENT: Positive for dental problem. Negative for congestion, drooling, ear pain, facial swelling, hearing loss, mouth sores, sneezing, sore throat, trouble swallowing and voice change.   Eyes: Negative for pain, discharge, redness, itching and visual disturbance.  Respiratory: Negative for cough, choking, shortness of breath and wheezing.   Cardiovascular: Negative for chest pain, palpitations and leg swelling.  Gastrointestinal: Negative for abdominal pain, blood in stool, constipation, diarrhea  and vomiting.  Endocrine: Negative for cold intolerance, heat intolerance and polydipsia.  Genitourinary: Negative for decreased urine volume, dysuria and hematuria.  Musculoskeletal: Positive for back pain. Negative for arthralgias and gait problem.  Skin: Negative for rash.  Allergic/Immunologic: Negative for environmental allergies.  Neurological: Negative for seizures, syncope, light-headedness and headaches.  Hematological: Negative for adenopathy.  Psychiatric/Behavioral: Negative for agitation, dysphoric mood and suicidal ideas. The patient is not nervous/anxious.     Per HPI unless specifically indicated above     Objective:    BP 129/75 (BP Location: Right Arm, Patient Position: Sitting, Cuff Size: Normal)   Pulse 61   Temp 97.7 F (36.5 C)   Ht 5\' 10"  (1.778 m)   Wt 237 lb (107.5 kg)   SpO2 97%   BMI 34.01 kg/m   Wt Readings from Last 3 Encounters:  10/03/18 237 lb (107.5 kg)  09/27/18 230 lb (104.3 kg)  09/26/18 240 lb (108.9 kg)    Physical Exam Vitals signs reviewed.  Constitutional:      Appearance: He is well-developed.  HENT:     Head: Normocephalic and atraumatic.  Neck:     Musculoskeletal: Neck supple.  Cardiovascular:     Rate and Rhythm: Normal rate and regular rhythm.  Pulmonary:     Effort: Pulmonary effort is normal.     Breath sounds: Normal breath sounds. No wheezing.  Abdominal:     General: Bowel sounds are normal.  Palpations: Abdomen is soft.     Tenderness: There is no abdominal tenderness.  Lymphadenopathy:     Cervical: No cervical adenopathy.  Skin:    General: Skin is warm and dry.  Neurological:     Mental Status: He is alert and oriented to person, place, and time.  Psychiatric:        Behavior: Behavior normal.         Assessment & Plan:    Encounter Diagnoses  Name Primary?  . Essential hypertension Yes  . Hyperlipidemia, unspecified hyperlipidemia type   . Obesity, unspecified classification, unspecified  obesity type, unspecified whether serious comorbidity present   . Thoracic aortic aneurysm without rupture (HCC)   . Screening for colon cancer      -pt is given ifobt for colon cancer screening -will Check fasting labs and call pt with results -thoracic aneursym- repeat imaging due Jul 10, 2019 -pt to Continue current bp medications -pt counseled to watch low-fat diet -pt to follow up 3 years.  RTO sooner prn

## 2018-10-08 ENCOUNTER — Other Ambulatory Visit (HOSPITAL_COMMUNITY)
Admission: RE | Admit: 2018-10-08 | Discharge: 2018-10-08 | Disposition: A | Discharge status: Home / Self Care | Payer: No Typology Code available for payment source | Source: Ambulatory Visit | Attending: Physician Assistant | Admitting: Physician Assistant

## 2018-10-08 ENCOUNTER — Other Ambulatory Visit: Payer: Self-pay

## 2018-10-08 DIAGNOSIS — E785 Hyperlipidemia, unspecified: Secondary | ICD-10-CM

## 2018-10-08 DIAGNOSIS — I10 Essential (primary) hypertension: Secondary | ICD-10-CM | POA: Insufficient documentation

## 2018-10-08 LAB — COMPREHENSIVE METABOLIC PANEL
ALBUMIN: 4.2 g/dL (ref 3.5–5.0)
ALT: 20 U/L (ref 0–44)
AST: 21 U/L (ref 15–41)
Alkaline Phosphatase: 64 U/L (ref 38–126)
Anion gap: 6 (ref 5–15)
BUN: 14 mg/dL (ref 6–20)
CHLORIDE: 106 mmol/L (ref 98–111)
CO2: 27 mmol/L (ref 22–32)
CREATININE: 0.81 mg/dL (ref 0.61–1.24)
Calcium: 8.8 mg/dL — ABNORMAL LOW (ref 8.9–10.3)
GFR calc Af Amer: >60 mL/min (ref >60)
GFR calc non Af Amer: >60 mL/min (ref >60)
GLUCOSE: 112 mg/dL — AB (ref 70–99)
Potassium: 4.5 mmol/L (ref 3.5–5.1)
SODIUM: 139 mmol/L (ref 135–145)
Total Bilirubin: 0.8 mg/dL (ref 0.3–1.2)
Total Protein: 7 g/dL (ref 6.5–8.1)

## 2018-10-08 LAB — IFOBT (OCCULT BLOOD): IFOBT: NEGATIVE

## 2018-10-08 LAB — LIPID PANEL
Cholesterol: 172 mg/dL (ref 0–200)
HDL: 37 mg/dL — ABNORMAL LOW (ref >40)
LDL CALC: 113 mg/dL — AB (ref 0–99)
TRIGLYCERIDES: 110 mg/dL (ref <150)
Total CHOL/HDL Ratio: 4.6 RATIO
VLDL: 22 mg/dL (ref 0–40)

## 2018-10-10 ENCOUNTER — Ambulatory Visit: Payer: Self-pay | Admitting: Physician Assistant

## 2018-10-10 DIAGNOSIS — R42 Dizziness and giddiness: Secondary | ICD-10-CM

## 2018-10-10 MED ORDER — MECLIZINE HCL 25 MG PO TABS: 25.0000 mg | ORAL_TABLET | Freq: Three times a day (TID) | ORAL | 1 refills | Status: DC | PRN

## 2018-10-10 NOTE — Progress Notes (Signed)
   There were no vitals taken for this visit.   Subjective:    Patient ID: Shawn Pittman, male    DOB: 19-Aug-1965, 53 y.o.   MRN: [id number]  HPI: Shawn Pittman is a 53 y.o. male presenting on 10/10/2018 for Dizziness   HPI   This is a telemedicine appt through [employer] due to coronavirus pandemic  Pt c/o dizziness that started last night.  He says he gets dizzy when he moves his head.  He says if he sits still for 15 minutes it goes away.  Pt says he feels well otherwise.  He denies fever, cough, HA.  He says he does get a bit nauseous if he moves around too much and the dizziness goes on too long.  Pt denies CP, SOB, vision changes.   Pt wonders if he "knocked one of those ear crystals Arlan Organ whack" because he says he bumped his ear on the car door on Sunday.  No LOC, no HA, no vision changes.  No bruising to the ear or area where he bumped it.    Relevant past medical, surgical, family and social history reviewed and updated as indicated. Interim medical history since our last visit reviewed. Allergies and medications reviewed and updated.   Current Outpatient Medications:  .  lisinopril (PRINIVIL,ZESTRIL) 20 MG tablet, Take 2 tablets (40 mg total) by mouth daily. (Patient taking differently: Take 20 mg by mouth daily. ), Disp: 60 tablet, Rfl: 3 .  Omega-3 Fatty Acids (FISH OIL PO), Take 1 capsule by mouth every other day. , Disp: , Rfl:  .  traMADol (ULTRAM) 50 MG tablet, TAKE 1 TABLET BY MOUTH EVERY 6 HOURS AS NEEDED FOR PAIN, Disp: 50 tablet, Rfl: 3    Review of Systems  Per HPI unless specifically indicated above     Objective:    There were no vitals taken for this visit.  Wt Readings from Last 3 Encounters:  10/03/18 237 lb (107.5 kg)  09/27/18 230 lb (104.3 kg)  09/26/18 240 lb (108.9 kg)    Physical Exam Pulmonary:     Effort: Pulmonary effort is normal. No respiratory distress.  Neurological:     Mental Status: He is alert and oriented to person, place, and  time.  Psychiatric:        Mood and Affect: Mood normal.        Thought Content: Thought content normal.            Assessment & Plan:    Encounter Diagnosis  Name Primary?  . Vertigo Yes    -counseled pt on vertigo -Will send rx meclizine to [facility] [place] and coupon faxed -Pt counseled to use caution due to meclize causting sedation -Pt to call office if symptoms persist or if new symptoms develop. -pt agrees with plans and has all his questions answered. -pt to follow up here as scheduled

## 2018-10-22 ENCOUNTER — Other Ambulatory Visit: Payer: Self-pay | Admitting: Orthopaedic Surgery

## 2018-10-24 ENCOUNTER — Ambulatory Visit: Payer: Self-pay | Admitting: Physician Assistant

## 2018-10-24 DIAGNOSIS — R42 Dizziness and giddiness: Secondary | ICD-10-CM

## 2018-10-24 NOTE — Progress Notes (Signed)
   There were no vitals taken for this visit.   Subjective:    Patient ID: Shawn Pittman, male    DOB: [birth year], 53 y.o.   MRN: [id number]  HPI: Shawn Pittman is a 53 y.o. male presenting on 10/24/2018 for No chief complaint on file.   HPI   This is a telemedicine visit through [employer] due to coronavirus pandemic  Pt was diagnosed with vertigo 2 weeks ago.  He was given rx for meclizine which he says helps a lot.   He says he is still having dizziness when he doesn't take the meclizine.    He says the meclizine made him drowsy when he first started taking it but it no longer makes him drowsy.  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    Relevant past medical, surgical, family and social history reviewed and updated as indicated. Interim medical history since our last visit reviewed. Allergies and medications reviewed and updated.   Current Outpatient Medications:  .  lisinopril (PRINIVIL,ZESTRIL) 20 MG tablet, Take 2 tablets (40 mg total) by mouth daily. (Patient taking differently: Take 20 mg by mouth daily. ), Disp: 60 tablet, Rfl: 3 .  meclizine (ANTIVERT) 25 MG tablet, Take 1 tablet (25 mg total) by mouth 3 (three) times daily as needed for dizziness., Disp: 30 tablet, Rfl: 1 .  Omega-3 Fatty Acids (FISH OIL PO), Take 1 capsule by mouth every other day. , Disp: , Rfl:  .  traMADol (ULTRAM) 50 MG tablet, TAKE 1 TABLET BY MOUTH EVERY 6 HOURS AS NEEDED FOR PAIN, Disp: 60 tablet, Rfl: 1   Review of Systems  Per HPI unless specifically indicated above     Objective:    There were no vitals taken for this visit.  Wt Readings from Last 3 Encounters:  10/03/18 237 lb (107.5 kg)  09/27/18 230 lb (104.3 kg)  09/26/18 240 lb (108.9 kg)    Physical Exam Constitutional:      Appearance: Normal appearance. He is obese.  HENT:     Head: Normocephalic and atraumatic.  Pulmonary:     Effort: Pulmonary effort is normal. No respiratory distress.  Neurological:     Mental  Status: He is alert and oriented to person, place, and time. Mental status is at baseline.  Psychiatric:        Mood and Affect: Mood normal.        Behavior: Behavior normal.         Assessment & Plan:   Encounter Diagnosis  Name Primary?  . Vertigo Yes    -pt counseled to continue meclizine as needed Will Mail information to pt on vertigo and also mail him a [facility] care application per his request -pt is counseled that he should avoid climbing on ladders until his vertigo resolves -pt is to call office if he is still having the vertigo in 2 weeks.  He is to call office sooner for worsening or new symptoms.

## 2018-10-24 NOTE — Patient Instructions (Signed)
Benign Positional Vertigo  Vertigo is the feeling that you or your surroundings are moving when they are not. Benign positional vertigo is the most common form of vertigo. This is usually a harmless condition (benign). This condition is positional. This means that symptoms are triggered by certain movements and positions.  This condition can be dangerous if it occurs while you are doing something that could cause harm to you or others. This includes activities such as driving or operating machinery.  What are the causes?  In many cases, the cause of this condition is not known. It may be caused by a disturbance in an area of the inner ear that helps your brain to sense movement and balance. This disturbance can be caused by:   Viral infection (labyrinthitis).   Head injury.   Repetitive motion, such as jumping, dancing, or running.  What increases the risk?  You are more likely to develop this condition if:   You are a woman.   You are 50 years of age or older.  What are the signs or symptoms?  Symptoms of this condition usually happen when you move your head or your eyes in different directions. Symptoms may start suddenly, and usually last for less than a minute. They include:   Loss of balance and falling.   Feeling like you are spinning or moving.   Feeling like your surroundings are spinning or moving.   Nausea and vomiting.   Blurred vision.   Dizziness.   Involuntary eye movement (nystagmus).  Symptoms can be mild and cause only minor problems, or they can be severe and interfere with daily life. Episodes of benign positional vertigo may return (recur) over time. Symptoms may improve over time.  How is this diagnosed?  This condition may be diagnosed based on:   Your medical history.   Physical exam of the head, neck, and ears.   Tests, such as:  ? MRI.  ? CT scan.  ? Eye movement tests. Your health care provider may ask you to change positions quickly while he or she watches you for symptoms  of benign positional vertigo, such as nystagmus. Eye movement may be tested with a variety of exams that are designed to evaluate or stimulate vertigo.  ? An electroencephalogram (EEG). This records electrical activity in your brain.  ? Hearing tests.  You may be referred to a health care provider who specializes in ear, nose, and throat (ENT) problems (otolaryngologist) or a provider who specializes in disorders of the nervous system (neurologist).  How is this treated?    This condition may be treated in a session in which your health care provider moves your head in specific positions to adjust your inner ear back to normal. Treatment for this condition may take several sessions. Surgery may be needed in severe cases, but this is rare.  In some cases, benign positional vertigo may resolve on its own in 2-4 weeks.  Follow these instructions at home:  Safety   Move slowly. Avoid sudden body or head movements or certain positions, as told by your health care provider.   Avoid driving until your health care provider says it is safe for you to do so.   Avoid operating heavy machinery until your health care provider says it is safe for you to do so.   Avoid doing any tasks that would be dangerous to you or others if vertigo occurs.   If you have trouble walking or keeping your balance, try using   a cane for stability. If you feel dizzy or unstable, sit down right away.   Return to your normal activities as told by your health care provider. Ask your health care provider what activities are safe for you.  General instructions   Take over-the-counter and prescription medicines only as told by your health care provider.   Drink enough fluid to keep your urine pale yellow.   Keep all follow-up visits as told by your health care provider. This is important.  Contact a health care provider if:   You have a fever.   Your condition gets worse or you develop new symptoms.   Your family or friends notice any  behavioral changes.   You have nausea or vomiting that gets worse.   You have numbness or a "pins and needles" sensation.  Get help right away if you:   Have difficulty speaking or moving.   Are always dizzy.   Faint.   Develop severe headaches.   Have weakness in your legs or arms.   Have changes in your hearing or vision.   Develop a stiff neck.   Develop sensitivity to light.  Summary   Vertigo is the feeling that you or your surroundings are moving when they are not. Benign positional vertigo is the most common form of vertigo.   The cause of this condition is not known. It may be caused by a disturbance in an area of the inner ear that helps your brain to sense movement and balance.   Symptoms include loss of balance and falling, feeling that you or your surroundings are moving, nausea and vomiting, and blurred vision.   This condition can be diagnosed based on symptoms, physical exam, and other tests, such as MRI, CT scan, eye movement tests, and hearing tests.   Follow safety instructions as told by your health care provider. You will also be told when to contact your health care provider in case of problems.  This information is not intended to replace advice given to you by your health care provider. Make sure you discuss any questions you have with your health care provider.  Document Released: 04/18/2006 Document Revised: 12/20/2017 Document Reviewed: 12/20/2017  Elsevier Interactive Patient Education  2019 [employer]

## 2018-11-13 ENCOUNTER — Ambulatory Visit: Payer: Self-pay | Admitting: Orthopaedic Surgery

## 2018-11-24 ENCOUNTER — Other Ambulatory Visit: Payer: Self-pay | Admitting: Orthopaedic Surgery

## 2018-12-25 ENCOUNTER — Other Ambulatory Visit: Payer: Self-pay | Admitting: Orthopaedic Surgery

## 2018-12-27 ENCOUNTER — Other Ambulatory Visit: Payer: Self-pay

## 2018-12-27 ENCOUNTER — Ambulatory Visit: Payer: Self-pay | Admitting: Orthopaedic Surgery

## 2018-12-27 ENCOUNTER — Encounter: Payer: Self-pay | Admitting: Orthopaedic Surgery

## 2018-12-27 VITALS — BP 173/85 | Temp 97.2°F | Ht 70.0 in | Wt 220.0 lb

## 2018-12-27 DIAGNOSIS — G8929 Other chronic pain: Secondary | ICD-10-CM

## 2018-12-27 DIAGNOSIS — M5442 Lumbago with sciatica, left side: Secondary | ICD-10-CM

## 2018-12-27 DIAGNOSIS — M5441 Lumbago with sciatica, right side: Secondary | ICD-10-CM

## 2018-12-27 DIAGNOSIS — I712 Thoracic aortic aneurysm, without rupture, unspecified: Secondary | ICD-10-CM

## 2018-12-27 MED ORDER — TRAMADOL HCL 50 MG PO TABS: 50.0000 mg | ORAL_TABLET | Freq: Four times a day (QID) | ORAL | 3 refills | Status: DC | PRN

## 2018-12-27 NOTE — Progress Notes (Signed)
Patient AV:WUJWJX:Shawn Pittman, male DOB:16-Oct-1965, 53 y.o. BJY:[id number]  Chief Complaint  Patient presents with  . Back Pain    HPI  Shawn Pittman is a 53 y.o. male who has chronic lower back pain.  He has some spasm at times. He has no weakness.  He is active and doing his exercises and taking his medicine.  He has no new trauma.   Body mass index is 31.57 kg/m.  ROS  Review of Systems  Constitutional: Positive for activity change.  Eyes:       False eye right  Musculoskeletal: Positive for arthralgias and back pain.  All other systems reviewed and are negative.   All other systems reviewed and are negative.  The following is a summary of the past history medically, past history surgically, known current medicines, social history and family history.  This information is gathered electronically by the computer from prior information and documentation.  I review this each visit and have found including this information at this point in the chart is beneficial and informative.    Past Medical History:  Diagnosis Date  . Back pain, chronic   . Hyperlipidemia   . Hypertension   . MRSA (methicillin resistant Staphylococcus aureus)     Past Surgical History:  Procedure Laterality Date  . EYE SURGERY  1989   removed R eye    Family History  Problem Relation Age of Onset  . Heart attack Mother   . Cancer Father     Social History Social History   Tobacco Use  . Smoking status: Never Smoker  . Smokeless tobacco: Never Used  Substance Use Topics  . Alcohol use: Not Currently    Comment: no ETOH since 2000  . Drug use: Not Currently    Types: Marijuana, Cocaine    Comment: none since 1998    No Known Allergies  Current Outpatient Medications  Medication Sig Dispense Refill  . lisinopril (PRINIVIL,ZESTRIL) 20 MG tablet Take 2 tablets (40 mg total) by mouth daily. (Patient taking differently: Take 20 mg by mouth daily. ) 60 tablet 3  . meclizine (ANTIVERT) 25  MG tablet Take 1 tablet (25 mg total) by mouth 3 (three) times daily as needed for dizziness. 30 tablet 1  . Omega-3 Fatty Acids (FISH OIL PO) Take 1 capsule by mouth every other day.     . traMADol (ULTRAM) 50 MG tablet Take 1 tablet (50 mg total) by mouth every 6 (six) hours as needed. for pain 60 tablet 3   No current facility-administered medications for this visit.      Physical Exam  Blood pressure (!) 173/85, temperature (!) 97.2 F (36.2 C), height 5\' 10"  (1.778 m), weight 220 lb (99.8 kg).  Constitutional: overall normal hygiene, normal nutrition, well developed, normal grooming, normal body habitus. Assistive device:none  Musculoskeletal: gait and station Limp none, muscle tone and strength are normal, no tremors or atrophy is present.  .  Neurological: coordination overall normal.  Deep tendon reflex/nerve stretch intact.  Sensation normal.  Cranial nerves II-XII intact.   Skin:   Normal overall no scars, lesions, ulcers or rashes. No psoriasis.  Psychiatric: Alert and oriented x 3.  Recent memory intact, remote memory unclear.  Normal mood and affect. Well groomed.  Good eye contact.  Cardiovascular: overall no swelling, no varicosities, no edema bilaterally, normal temperatures of the legs and arms, no clubbing, cyanosis and good capillary refill.  Lymphatic: palpation is normal.  Spine/Pelvis examination:  Inspection:  Overall, sacoiliac joint benign and hips nontender; without crepitus or defects.   Thoracic spine inspection: Alignment normal without kyphosis present   Lumbar spine inspection:  Alignment  with normal lumbar lordosis, without scoliosis apparent.   Thoracic spine palpation:  without tenderness of spinal processes   Lumbar spine palpation: without tenderness of lumbar area; without tightness of lumbar muscles    Range of Motion:   Lumbar flexion, forward flexion is normal without pain or tenderness    Lumbar extension is full without pain or  tenderness   Left lateral bend is normal without pain or tenderness   Right lateral bend is normal without pain or tenderness   Straight leg raising is normal  Strength & tone: normal   Stability overall normal stability  All other systems reviewed and are negative   The patient has been educated about the nature of the problem(s) and counseled on treatment options.  The patient appeared to understand what I have discussed and is in agreement with it.  Encounter Diagnoses  Name Primary?  . Chronic bilateral low back pain with bilateral sciatica Yes  . Thoracic aortic aneurysm without rupture [facility])     PLAN Call if any problems.  Precautions discussed.  Continue current medications.   Return to clinic 3 months   I have reviewed the [facility] Controlled Substance Reporting System web site prior to prescribing narcotic medicine for this patient.   Electronically Signed Darreld Mclean, MD 6/4/20208:16 AM

## 2019-01-02 ENCOUNTER — Encounter: Payer: Self-pay | Admitting: Physician Assistant

## 2019-01-02 ENCOUNTER — Ambulatory Visit: Payer: Self-pay | Admitting: Physician Assistant

## 2019-01-02 DIAGNOSIS — I10 Essential (primary) hypertension: Secondary | ICD-10-CM

## 2019-01-02 DIAGNOSIS — E785 Hyperlipidemia, unspecified: Secondary | ICD-10-CM

## 2019-01-02 DIAGNOSIS — Z125 Encounter for screening for malignant neoplasm of prostate: Secondary | ICD-10-CM

## 2019-01-02 MED ORDER — LISINOPRIL 20 MG PO TABS: 40.0000 mg | ORAL_TABLET | Freq: Every day | ORAL | 3 refills | Status: DC

## 2019-01-02 NOTE — Progress Notes (Signed)
There were no vitals taken for this visit.   Subjective:    Patient ID: Shawn Pittman, male    DOB: 1965-09-23, 53 y.o.   MRN: [id number]  HPI: Shawn Pittman is a 53 y.o. male presenting on 01/02/2019 for No chief complaint on file.   HPI   This is a telemedicine visit through [employer] due to coronavirus pandemic  I connected with  Shawn Pittman on 01/02/19 by a video enabled telemedicine application and verified that I am speaking with the correct person using two identifiers.   I discussed the limitations of evaluation and management by telemedicine. The patient expressed understanding and agreed to proceed.    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  He only occassionally has dizziness and it's not bad  Pt states bp high at orthopedist appointment because he had been arguing with his wife  He says he checked his bp twice at a friends house and it was 140/83 . Pt is only taking lisinopril 1 daily (20mg )  and not 2 daily (40mg ) as prescribed    Relevant past medical, surgical, family and social history reviewed and updated as indicated. Interim medical history since our last visit reviewed. Allergies and medications reviewed and updated.   Current Outpatient Medications:  .  lisinopril (PRINIVIL,ZESTRIL) 20 MG tablet, Take 2 tablets (40 mg total) by mouth daily. (Patient taking differently: Take 20 mg by mouth daily. ), Disp: 60 tablet, Rfl: 3 .  Omega-3 Fatty Acids (FISH OIL PO), Take 1 capsule by mouth every other day. , Disp: , Rfl:  .  traMADol (ULTRAM) 50 MG tablet, Take 1 tablet (50 mg total) by mouth every 6 (six) hours as needed. for pain, Disp: 60 tablet, Rfl: 3 .  meclizine (ANTIVERT) 25 MG tablet, Take 1 tablet (25 mg total) by mouth 3 (three) times daily as needed for dizziness. (Patient not taking: Reported on 01/02/2019), Disp: 30 tablet, Rfl: 1    Review of Systems  Per HPI unless specifically indicated above     Objective:     There were no vitals taken for this visit.  Wt Readings from Last 3 Encounters:  12/27/18 220 lb (99.8 kg)  10/03/18 237 lb (107.5 kg)  09/27/18 230 lb (104.3 kg)    Physical Exam Constitutional:      General: He is not in acute distress.    Appearance: Normal appearance. He is obese. He is not ill-appearing.  HENT:     Head: Normocephalic and atraumatic.  Pulmonary:     Effort: Pulmonary effort is normal. No respiratory distress.  Neurological:     Mental Status: He is alert and oriented to person, place, and time.  Psychiatric:        Attention and Perception: Attention normal.        Mood and Affect: Mood normal.        Speech: Speech normal.        Behavior: Behavior normal. Behavior is cooperative.        Cognition and Memory: Cognition normal.            Assessment & Plan:   Encounter Diagnosis  Name Primary?  . Essential hypertension Yes     -Pt encouraged to wear mask when in public per recommendations of CDC to reduce risk of getting covid-19 virus -pt encouraged to take lisinopril 40mg  as prescribed to adequately control his blood pressure and limit long term damage of uncontrolled  htn.  Pt agrees -pt to follow up 3 months.  Will update labs prior to that appointment.  Pt to contact office sooner prn

## 2019-01-16 ENCOUNTER — Telehealth: Payer: Self-pay | Admitting: Physician Assistant

## 2019-01-16 NOTE — Telephone Encounter (Signed)
Pt. Did not want to be part of [facility].   Drema Halon

## 2019-02-23 ENCOUNTER — Other Ambulatory Visit: Payer: Self-pay | Admitting: Orthopaedic Surgery

## 2019-03-28 ENCOUNTER — Ambulatory Visit (INDEPENDENT_AMBULATORY_CARE_PROVIDER_SITE_OTHER): Payer: No Typology Code available for payment source | Admitting: Orthopaedic Surgery

## 2019-03-28 ENCOUNTER — Other Ambulatory Visit: Payer: Self-pay

## 2019-03-28 ENCOUNTER — Encounter: Payer: Self-pay | Admitting: Orthopaedic Surgery

## 2019-03-28 ENCOUNTER — Ambulatory Visit: Payer: No Typology Code available for payment source

## 2019-03-28 VITALS — BP 133/83 | HR 66 | Ht 70.0 in | Wt 224.0 lb

## 2019-03-28 DIAGNOSIS — M542 Cervicalgia: Secondary | ICD-10-CM

## 2019-03-28 NOTE — Addendum Note (Signed)
Addended by: Derek Mound A on: 03/28/2019 03:44 PM   Modules accepted: Orders

## 2019-03-28 NOTE — Progress Notes (Signed)
Patient ZO:XWRUEA:Shawn Sanda KleinL Flicker, male DOB:20-Mar-1966, 53 y.o. VWU:[id number]  Chief Complaint  Patient presents with  . Back Pain  . Neck Pain    HPI  Shawn Pittman is a 53 y.o. male who has neck pain and when he bends his neck to full extension he has dizziness even if laying down.  He has seen his family doctor and was put on Anti-vert which helps but he cannot keep taking that.  He has no problem in other ranges of motion.  He sometimes has some numbness of the fingers but not often.  He has known small aneurysm of the distal aorta.  I told him his problem could be vascular as well as musculoskeletal and nerve problem.  I will get MRI of the neck.     Body mass index is 32.14 kg/m.  ROS  Review of Systems  Constitutional: Positive for activity change.  Eyes:       False eye right  Musculoskeletal: Positive for arthralgias and back pain.  All other systems reviewed and are negative.   All other systems reviewed and are negative.  The following is a summary of the past history medically, past history surgically, known current medicines, social history and family history.  This information is gathered electronically by the computer from prior information and documentation.  I review this each visit and have found including this information at this point in the chart is beneficial and informative.    Past Medical History:  Diagnosis Date  . Back pain, chronic   . Hyperlipidemia   . Hypertension   . MRSA (methicillin resistant Staphylococcus aureus)     Past Surgical History:  Procedure Laterality Date  . EYE SURGERY  1989   removed R eye    Family History  Problem Relation Age of Onset  . Heart attack Mother   . Cancer Father     Social History Social History   Tobacco Use  . Smoking status: Never Smoker  . Smokeless tobacco: Never Used  Substance Use Topics  . Alcohol use: Not Currently    Comment: no ETOH since 2000  . Drug use: Not Currently    Types:  Marijuana, Cocaine    Comment: none since 1998    No Known Allergies  Current Outpatient Medications  Medication Sig Dispense Refill  . lisinopril (ZESTRIL) 20 MG tablet Take 2 tablets (40 mg total) by mouth daily. 60 tablet 3  . Omega-3 Fatty Acids (FISH OIL PO) Take 1 capsule by mouth every other day.     . traMADol (ULTRAM) 50 MG tablet TAKE 1 TABLET (50 MG TOTAL) BY MOUTH EVERY 6 (SIX) HOURS AS NEEDED. FOR PAIN 60 tablet 3  . meclizine (ANTIVERT) 25 MG tablet Take 1 tablet (25 mg total) by mouth 3 (three) times daily as needed for dizziness. (Patient not taking: Reported on 03/28/2019) 30 tablet 1   No current facility-administered medications for this visit.      Physical Exam  Blood pressure 133/83, pulse 66, height 5\' 10"  (1.778 m), weight 224 lb (101.6 kg).  Constitutional: overall normal hygiene, normal nutrition, well developed, normal grooming, normal body habitus. Assistive device:none  Musculoskeletal: gait and station Limp none, muscle tone and strength are normal, no tremors or atrophy is present.  .  Neurological: coordination overall normal.  Deep tendon reflex/nerve stretch intact.  Sensation normal.  Cranial nerves II-XII intact.   Skin:   Normal overall no scars, lesions, ulcers or rashes. No psoriasis.  Psychiatric:  Alert and oriented x 3.  Recent memory intact, remote memory unclear.  Normal mood and affect. Well groomed.  Good eye contact.  Cardiovascular: overall no swelling, no varicosities, no edema bilaterally, normal temperatures of the legs and arms, no clubbing, cyanosis and good capillary refill.  Lymphatic: palpation is normal.  He has full ROM of the neck and grips are normal.  NV intact.  All other systems reviewed and are negative   The patient has been educated about the nature of the problem(s) and counseled on treatment options.  The patient appeared to understand what I have discussed and is in agreement with it.  Encounter Diagnosis   Name Primary?  . Cervicalgia Yes    PLAN Call if any problems.  Precautions discussed.  Continue current medications.   Return to clinic 2 weeks   Get MRI of the cervical spine, with and without contrast.  He may need vascular studies of the neck area, carotid and vertebral arteries in the future.  Return after MRI.  Electronically Signed Sanjuana Kava, MD 9/3/202011:46 AM

## 2019-04-02 ENCOUNTER — Telehealth: Payer: Self-pay | Admitting: Radiology

## 2019-04-02 NOTE — Telephone Encounter (Signed)
Scheduling Called patient to schedule MRI. Patient states he will call us once he is approved for financial assistance. Declined to schedule.    When he is approved, will reopen MRI order  To you FYI

## 2019-04-04 ENCOUNTER — Other Ambulatory Visit (HOSPITAL_COMMUNITY)
Admission: RE | Admit: 2019-04-04 | Discharge: 2019-04-04 | Disposition: A | Discharge status: Home / Self Care | Payer: No Typology Code available for payment source | Source: Ambulatory Visit | Attending: Physician Assistant | Admitting: Physician Assistant

## 2019-04-04 DIAGNOSIS — E785 Hyperlipidemia, unspecified: Secondary | ICD-10-CM

## 2019-04-04 DIAGNOSIS — I10 Essential (primary) hypertension: Secondary | ICD-10-CM | POA: Insufficient documentation

## 2019-04-04 DIAGNOSIS — Z125 Encounter for screening for malignant neoplasm of prostate: Secondary | ICD-10-CM

## 2019-04-04 LAB — LIPID PANEL
Cholesterol: 185 mg/dL (ref 0–200)
HDL: 38 mg/dL — ABNORMAL LOW (ref >40)
LDL Cholesterol: 124 mg/dL — ABNORMAL HIGH (ref 0–99)
Total CHOL/HDL Ratio: 4.9 RATIO
Triglycerides: 113 mg/dL (ref <150)
VLDL: 23 mg/dL (ref 0–40)

## 2019-04-04 LAB — COMPREHENSIVE METABOLIC PANEL
ALT: 22 U/L (ref 0–44)
AST: 22 U/L (ref 15–41)
Albumin: 4.2 g/dL (ref 3.5–5.0)
Alkaline Phosphatase: 67 U/L (ref 38–126)
Anion gap: 7 (ref 5–15)
BUN: 18 mg/dL (ref 6–20)
CO2: 25 mmol/L (ref 22–32)
Calcium: 8.7 mg/dL — ABNORMAL LOW (ref 8.9–10.3)
Chloride: 105 mmol/L (ref 98–111)
Creatinine, Ser: 0.73 mg/dL (ref 0.61–1.24)
GFR calc Af Amer: >60 mL/min (ref >60)
GFR calc non Af Amer: >60 mL/min (ref >60)
Glucose, Bld: 105 mg/dL — ABNORMAL HIGH (ref 70–99)
Potassium: 4.3 mmol/L (ref 3.5–5.1)
Sodium: 137 mmol/L (ref 135–145)
Total Bilirubin: 0.8 mg/dL (ref 0.3–1.2)
Total Protein: 7.2 g/dL (ref 6.5–8.1)

## 2019-04-04 LAB — PSA: Prostatic Specific Antigen: 0.53 ng/mL (ref 0.00–4.00)

## 2019-04-09 ENCOUNTER — Encounter: Payer: Self-pay | Admitting: Physician Assistant

## 2019-04-09 ENCOUNTER — Ambulatory Visit: Payer: Self-pay | Admitting: Physician Assistant

## 2019-04-09 DIAGNOSIS — M542 Cervicalgia: Secondary | ICD-10-CM

## 2019-04-09 DIAGNOSIS — I712 Thoracic aortic aneurysm, without rupture, unspecified: Secondary | ICD-10-CM

## 2019-04-09 DIAGNOSIS — I10 Essential (primary) hypertension: Secondary | ICD-10-CM

## 2019-04-09 DIAGNOSIS — E785 Hyperlipidemia, unspecified: Secondary | ICD-10-CM

## 2019-04-09 MED ORDER — LISINOPRIL 20 MG PO TABS: 40.0000 mg | ORAL_TABLET | Freq: Every day | ORAL | 4 refills | Status: DC

## 2019-04-09 NOTE — Progress Notes (Signed)
fr  There were no vitals taken for this visit.   Subjective:    Patient ID: Shawn Pittman, male    DOB: 1966-04-01, 53 y.o.   MRN: [id number]  HPI: Shawn Pittman is a 53 y.o. male presenting on 04/09/2019 for No chief complaint on file.   HPI   This is a telemedicine appointment through [employer] due to coronavirus pandemic.  I connected with  Shawn Pittman on 04/09/19 by a video enabled telemedicine application and verified that I am speaking with the correct person using two identifiers.   I discussed the limitations of evaluation and management by telemedicine. The patient expressed understanding and agreed to proceed.  Pt is at work.  Provider is at office.    Pt says he is doing well.  He now has a BP machine at home.  He says his bp runs 130-140.  He checks it 2 or 3 times/week.  bp was 133/83 at orthopedist on 03/28/19.  He says he mostly takes 40mg  lisinopril daily with only occasionally taking 20mg  qd.     Pt has been seen by orthopedist for neck pain and an MRI was ordered  Pt has not yet been approved for [facility] [employer] [facility] financial assistance.  He has been in contact with financial counselor and is working on that  Pt is otherwise doing well and says he has been working a lot.  Relevant past medical, surgical, family and social history reviewed and updated as indicated. Interim medical history since our last visit reviewed. Allergies and medications reviewed and updated.    Current Outpatient Medications:  .  lisinopril (ZESTRIL) 20 MG tablet, Take 2 tablets (40 mg total) by mouth daily., Disp: 60 tablet, Rfl: 3 .  meclizine (ANTIVERT) 25 MG tablet, Take 1 tablet (25 mg total) by mouth 3 (three) times daily as needed for dizziness., Disp: 30 tablet, Rfl: 1 .  traMADol (ULTRAM) 50 MG tablet, TAKE 1 TABLET (50 MG TOTAL) BY MOUTH EVERY 6 (SIX) HOURS AS NEEDED. FOR PAIN, Disp: 60 tablet, Rfl: 3 .  Omega-3 Fatty Acids (FISH OIL PO), Take 1 capsule by mouth every other  day. , Disp: , Rfl:      Review of Systems  Per HPI unless specifically indicated above     Objective:    There were no vitals taken for this visit.  Wt Readings from Last 3 Encounters:  03/28/19 224 lb (101.6 kg)  12/27/18 220 lb (99.8 kg)  10/03/18 237 lb (107.5 kg)    Physical Exam Constitutional:      General: He is not in acute distress.    Appearance: Normal appearance. He is not ill-appearing.  HENT:     Head: Normocephalic and atraumatic.  Pulmonary:     Effort: Pulmonary effort is normal. No respiratory distress.  Neurological:     Mental Status: He is alert and oriented to person, place, and time.  Psychiatric:        Attention and Perception: Attention normal.        Mood and Affect: Mood normal.        Speech: Speech normal.        Behavior: Behavior is cooperative.     Results for orders placed or performed during the hospital encounter of 04/04/19  Comprehensive metabolic panel  Result Value Ref Range   Sodium 137 135 - 145 mmol/L   Potassium 4.3 3.5 - 5.1 mmol/L   Chloride 105 98 - 111 mmol/L   CO2  25 22 - 32 mmol/L   Glucose, Bld 105 (H) 70 - 99 mg/dL   BUN 18 6 - 20 mg/dL   Creatinine, Ser 1.610.73 0.61 - 1.24 mg/dL   Calcium 8.7 (L) 8.9 - 10.3 mg/dL   Total Protein 7.2 6.5 - 8.1 g/dL   Albumin 4.2 3.5 - 5.0 g/dL   AST 22 15 - 41 U/L   ALT 22 0 - 44 U/L   Alkaline Phosphatase 67 38 - 126 U/L   Total Bilirubin 0.8 0.3 - 1.2 mg/dL   GFR calc non Af Amer >60 >60 mL/min   GFR calc Af Amer >60 >60 mL/min   Anion gap 7 5 - 15  Lipid panel  Result Value Ref Range   Cholesterol 185 0 - 200 mg/dL   Triglycerides 096113 <045<150 mg/dL   HDL 38 (L) >40>40 mg/dL   Total CHOL/HDL Ratio 4.9 RATIO   VLDL 23 0 - 40 mg/dL   LDL Cholesterol 981124 (H) 0 - 99 mg/dL  PSA  Result Value Ref Range   Prostatic Specific Antigen 0.53 0.00 - 4.00 ng/mL      Assessment & Plan:    Encounter Diagnoses  Name Primary?  . Essential hypertension Yes  . Hyperlipidemia,  unspecified hyperlipidemia type   . Thoracic aortic aneurysm without rupture (HCC)   . Cervicalgia     -reviewed labs with pt  -pt to continue lisinoprl 40mg  daily for blood pressure  -encouraged pt to continue lowfat diet.  Discussed limiting fried foods and red meat.  Pt is not interested in medication for his lipids at this time  -pt to continue with orthopedics per his recommendation.  Discussed that he might need US of neck (carotids) depending upon the results of his MRI and his symptoms of dizziness  -pt will be due for imaging of thoracic aortic aneurysm in mid-December  -pt to follow up 3 months.  He is to contact office sooner prn

## 2019-04-29 ENCOUNTER — Telehealth: Payer: Self-pay

## 2019-04-29 NOTE — Telephone Encounter (Signed)
Tramadol 50 MG  Qty  60 Tablets   PATIENT USES [facility]

## 2019-04-30 MED ORDER — TRAMADOL HCL 50 MG PO TABS: 50.0000 mg | ORAL_TABLET | Freq: Four times a day (QID) | ORAL | 3 refills | Status: DC | PRN

## 2019-06-11 ENCOUNTER — Ambulatory Visit (HOSPITAL_COMMUNITY): Payer: No Typology Code available for payment source

## 2019-06-18 ENCOUNTER — Ambulatory Visit (HOSPITAL_COMMUNITY)
Admission: RE | Admit: 2019-06-18 | Discharge: 2019-06-18 | Disposition: A | Discharge status: Home / Self Care | Payer: Self-pay | Source: Ambulatory Visit | Attending: Orthopaedic Surgery | Admitting: Orthopaedic Surgery

## 2019-06-18 ENCOUNTER — Other Ambulatory Visit: Payer: Self-pay

## 2019-06-18 DIAGNOSIS — M542 Cervicalgia: Secondary | ICD-10-CM | POA: Insufficient documentation

## 2019-06-18 LAB — POCT I-STAT CREATININE: Creatinine, Ser: 0.8 mg/dL (ref 0.61–1.24)

## 2019-06-18 MED ORDER — GADOBUTROL 1 MMOL/ML IV SOLN
10.0000 mL | Freq: Once | INTRAVENOUS | Status: AC | PRN
  Administered 2019-06-18: 09:00:00 10 mL via INTRAVENOUS

## 2019-06-28 ENCOUNTER — Other Ambulatory Visit: Payer: Self-pay | Admitting: Orthopaedic Surgery

## 2019-07-01 NOTE — Telephone Encounter (Signed)
Also received faxed request for this medication from [facility] (in addition to [employer] request) for: traMADol (ULTRAM) 50 MG tablet 60 tablet

## 2019-07-02 ENCOUNTER — Other Ambulatory Visit: Payer: Self-pay | Admitting: Physician Assistant

## 2019-07-02 DIAGNOSIS — I712 Thoracic aortic aneurysm, without rupture, unspecified: Secondary | ICD-10-CM

## 2019-07-15 ENCOUNTER — Ambulatory Visit (INDEPENDENT_AMBULATORY_CARE_PROVIDER_SITE_OTHER): Payer: Self-pay | Admitting: Orthopaedic Surgery

## 2019-07-15 ENCOUNTER — Encounter: Payer: Self-pay | Admitting: Orthopaedic Surgery

## 2019-07-15 ENCOUNTER — Other Ambulatory Visit: Payer: Self-pay

## 2019-07-15 VITALS — BP 118/67 | HR 73 | Ht 70.0 in | Wt 224.0 lb

## 2019-07-15 DIAGNOSIS — M542 Cervicalgia: Secondary | ICD-10-CM

## 2019-07-15 MED ORDER — TRAMADOL HCL 50 MG PO TABS: ORAL_TABLET | ORAL | 3 refills | Status: DC

## 2019-07-15 NOTE — Progress Notes (Signed)
Patient Shawn Pittman Shawn Pittman, male DOB:1966-02-07, 53 y.o. YBO:[id number]  Chief Complaint  Patient presents with  . Neck Pain  . Back Pain  . Results    review Cervical MRI scan     HPI  Shawn Pittman is a 53 y.o. male who has neck pain.  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  I have explained the findings to him.  I will refill his Ultram and give refills.  He has some arthritic pains of the lower back as well.   Body mass index is 32.14 kg/m.  ROS  Review of Systems  Constitutional: Positive for activity change.  Eyes:       False eye right  Musculoskeletal: Positive for arthralgias and back pain.  All other systems reviewed and are negative.   All other systems reviewed and are negative.  The following is a summary of the past history medically, past history surgically, known current medicines, social history and family history.  This information is gathered electronically by the computer from prior information and documentation.  I review this each visit and have found including this information at this point in the chart is beneficial and informative.    Past Medical History:  Diagnosis Date  . Back pain, chronic   . Hyperlipidemia   . Hypertension   . MRSA (methicillin resistant Staphylococcus aureus)     Past Surgical History:  Procedure Laterality Date  . EYE SURGERY  1989   removed R eye    Family History  Problem Relation Age of Onset  . Heart attack Mother   . Cancer Father     Social History Social History   Tobacco Use  . Smoking status: Never Smoker  . Smokeless tobacco: Never Used  Substance Use Topics  . Alcohol use: Not Currently    Comment: no ETOH since 2000  . Drug use: Not Currently    Types: Marijuana, Cocaine    Comment: none since 1998    No Known Allergies  Current Outpatient Medications  Medication Sig  Dispense Refill  . lisinopril (ZESTRIL) 20 MG tablet Take 2 tablets (40 mg total) by mouth daily. 60 tablet 4  . meclizine (ANTIVERT) 25 MG tablet Take 1 tablet (25 mg total) by mouth 3 (three) times daily as needed for dizziness. 30 tablet 1  . Omega-3 Fatty Acids (FISH OIL PO) Take 1 capsule by mouth every other day.     . traMADol (ULTRAM) 50 MG tablet One tablet every six hours as needed for pain. 60 tablet 3   No current facility-administered medications for this visit.     Physical Exam  Blood pressure 118/67, pulse 73, height 5\' 10"  (1.778 m), weight 224 lb (101.6 kg).  Constitutional: overall normal hygiene, normal nutrition, well developed, normal grooming, normal body habitus. Assistive device:none  Musculoskeletal: gait and station Limp none, muscle tone and strength are normal, no tremors or atrophy is present.  .  Neurological: coordination overall normal.  Deep tendon reflex/nerve stretch intact.  Sensation normal.  Cranial nerves II-XII intact.   Skin:   Normal overall no scars, lesions, ulcers or rashes. No psoriasis.  Psychiatric: Alert and oriented x 3.  Recent memory intact, remote memory unclear.  Normal mood and affect. Well groomed.  Good eye contact.  Cardiovascular: overall no swelling, no varicosities, no edema bilaterally, normal temperatures of the legs and  arms, no clubbing, cyanosis and good capillary refill.  Lymphatic: palpation is normal.  Neck is tender but has full ROM.  NV intact., Grips normal.  All other systems reviewed and are negative   The patient has been educated about the nature of the problem(s) and counseled on treatment options.  The patient appeared to understand what I have discussed and is in agreement with it.  Encounter Diagnosis  Name Primary?  . Cervicalgia Yes   PLAN Call if any problems.  Precautions discussed.  Continue current medications.   Return to clinic 3 months   I have reviewed the [facility] Controlled  Substance Reporting System web site prior to prescribing narcotic medicine for this patient.   Electronically Signed Darreld Mclean, MD 12/21/20201:35 PM

## 2019-07-18 ENCOUNTER — Other Ambulatory Visit: Payer: Self-pay

## 2019-07-18 ENCOUNTER — Ambulatory Visit (HOSPITAL_COMMUNITY)
Admission: RE | Admit: 2019-07-18 | Discharge: 2019-07-18 | Disposition: A | Discharge status: Home / Self Care | Payer: Self-pay | Source: Ambulatory Visit | Attending: Physician Assistant | Admitting: Physician Assistant

## 2019-07-18 DIAGNOSIS — I712 Thoracic aortic aneurysm, without rupture, unspecified: Secondary | ICD-10-CM

## 2019-07-18 MED ORDER — IOHEXOL 350 MG/ML SOLN
100.0000 mL | Freq: Once | INTRAVENOUS | Status: AC | PRN
  Administered 2019-07-18: 100 mL via INTRAVENOUS

## 2019-08-05 ENCOUNTER — Ambulatory Visit: Payer: Self-pay | Admitting: Physician Assistant

## 2019-08-05 ENCOUNTER — Encounter: Payer: Self-pay | Admitting: Physician Assistant

## 2019-08-05 VITALS — BP 143/84

## 2019-08-05 DIAGNOSIS — I10 Essential (primary) hypertension: Secondary | ICD-10-CM

## 2019-08-05 DIAGNOSIS — I712 Thoracic aortic aneurysm, without rupture, unspecified: Secondary | ICD-10-CM

## 2019-08-05 DIAGNOSIS — E785 Hyperlipidemia, unspecified: Secondary | ICD-10-CM

## 2019-08-05 MED ORDER — ATORVASTATIN CALCIUM 20 MG PO TABS: 20.0000 mg | ORAL_TABLET | Freq: Every day | ORAL | 4 refills | Status: DC

## 2019-08-05 MED ORDER — LISINOPRIL 20 MG PO TABS: 40.0000 mg | ORAL_TABLET | Freq: Every day | ORAL | 4 refills | Status: AC

## 2019-08-05 NOTE — Progress Notes (Signed)
   BP (!) 143/84    Subjective:    Patient ID: Shawn Pittman, male    DOB: 02/17/1966, 54 y.o.   MRN: [id number]  HPI: Shawn Pittman is a 54 y.o. male presenting on 08/05/2019 for No chief complaint on file.   HPI   This is a telemedicine visit through [employer] due to coronavirus pandemic.  I connected with  Shawn Pittman on 08/05/19 by a video enabled telemedicine application and verified that I am speaking with the correct person using two identifiers.   I discussed the limitations of evaluation and management by telemedicine. The patient expressed understanding and agreed to proceed.  Pt is in his parked truck somewhere in [place] (where he is getting ready to work).  Provider is at office    He says he is well.  He checks his bp often and says it runs 131-150    Relevant past medical, surgical, family and social history reviewed and updated as indicated. Interim medical history since our last visit reviewed. Allergies and medications reviewed and updated.    Current Outpatient Medications:  .  lisinopril (ZESTRIL) 20 MG tablet, Take 2 tablets (40 mg total) by mouth daily., Disp: 60 tablet, Rfl: 4 .  traMADol (ULTRAM) 50 MG tablet, One tablet every six hours as needed for pain., Disp: 60 tablet, Rfl: 3 .  meclizine (ANTIVERT) 25 MG tablet, Take 1 tablet (25 mg total) by mouth 3 (three) times daily as needed for dizziness. (Patient not taking: Reported on 08/05/2019), Disp: 30 tablet, Rfl: 1 .  Omega-3 Fatty Acids (FISH OIL PO), Take 1 capsule by mouth every other day. , Disp: , Rfl:      Review of Systems  Per HPI unless specifically indicated above     Objective:    BP (!) 143/84   Wt Readings from Last 3 Encounters:  07/15/19 224 lb (101.6 kg)  03/28/19 224 lb (101.6 kg)  12/27/18 220 lb (99.8 kg)    Physical Exam Constitutional:      General: He is not in acute distress.    Appearance: He is not ill-appearing.  HENT:     Head: Normocephalic and  atraumatic.  Pulmonary:     Effort: Pulmonary effort is normal. No respiratory distress.  Neurological:     Mental Status: He is alert and oriented to person, place, and time.  Psychiatric:        Attention and Perception: Attention normal.        Speech: Speech normal.        Behavior: Behavior is cooperative.           Assessment & Plan:    Encounter Diagnoses  Name Primary?  . Essential hypertension Yes  . Hyperlipidemia, unspecified hyperlipidemia type   . Thoracic aortic aneurysm without rupture (HCC)      -discussed that he needs to optimize his BP and lipid profile particularly in light of aneurysm -Pt encouraged to Take 40mg  lisinopril -Pt agrees to try statin-  rx for lipitor sent to [facility] [place] -pt to get Fasting labs this week -pt to follow up 3 months.  He is to contact office sooner prn

## 2019-08-06 ENCOUNTER — Other Ambulatory Visit (HOSPITAL_COMMUNITY)
Admission: RE | Admit: 2019-08-06 | Discharge: 2019-08-06 | Disposition: A | Discharge status: Home / Self Care | Payer: Self-pay | Source: Ambulatory Visit | Attending: Physician Assistant | Admitting: Physician Assistant

## 2019-08-06 ENCOUNTER — Other Ambulatory Visit: Payer: Self-pay

## 2019-08-06 DIAGNOSIS — I10 Essential (primary) hypertension: Secondary | ICD-10-CM | POA: Insufficient documentation

## 2019-08-06 DIAGNOSIS — E785 Hyperlipidemia, unspecified: Secondary | ICD-10-CM | POA: Insufficient documentation

## 2019-08-06 DIAGNOSIS — I712 Thoracic aortic aneurysm, without rupture, unspecified: Secondary | ICD-10-CM

## 2019-08-06 LAB — COMPREHENSIVE METABOLIC PANEL
ALT: 17 U/L (ref 0–44)
AST: 18 U/L (ref 15–41)
Albumin: 4.2 g/dL (ref 3.5–5.0)
Alkaline Phosphatase: 64 U/L (ref 38–126)
Anion gap: 8 (ref 5–15)
BUN: 19 mg/dL (ref 6–20)
CO2: 27 mmol/L (ref 22–32)
Calcium: 9.1 mg/dL (ref 8.9–10.3)
Chloride: 102 mmol/L (ref 98–111)
Creatinine, Ser: 0.85 mg/dL (ref 0.61–1.24)
GFR calc Af Amer: >60 mL/min (ref >60)
GFR calc non Af Amer: >60 mL/min (ref >60)
Glucose, Bld: 107 mg/dL — ABNORMAL HIGH (ref 70–99)
Potassium: 4.5 mmol/L (ref 3.5–5.1)
Sodium: 137 mmol/L (ref 135–145)
Total Bilirubin: 0.9 mg/dL (ref 0.3–1.2)
Total Protein: 7.5 g/dL (ref 6.5–8.1)

## 2019-08-06 LAB — LIPID PANEL
Cholesterol: 201 mg/dL — ABNORMAL HIGH (ref 0–200)
HDL: 39 mg/dL — ABNORMAL LOW (ref >40)
LDL Cholesterol: 130 mg/dL — ABNORMAL HIGH (ref 0–99)
Total CHOL/HDL Ratio: 5.2 RATIO
Triglycerides: 161 mg/dL — ABNORMAL HIGH (ref <150)
VLDL: 32 mg/dL (ref 0–40)

## 2019-09-07 ENCOUNTER — Other Ambulatory Visit: Payer: Self-pay | Admitting: Physician Assistant

## 2019-09-07 DIAGNOSIS — I10 Essential (primary) hypertension: Secondary | ICD-10-CM

## 2019-09-07 DIAGNOSIS — E785 Hyperlipidemia, unspecified: Secondary | ICD-10-CM

## 2019-09-11 ENCOUNTER — Other Ambulatory Visit: Payer: Self-pay | Admitting: Orthopaedic Surgery

## 2019-09-11 NOTE — Telephone Encounter (Signed)
Refill request

## 2019-09-13 ENCOUNTER — Telehealth: Payer: Self-pay | Admitting: Radiology

## 2019-09-13 NOTE — Telephone Encounter (Signed)
Patient called when we were closed, LM VM asking about Rx refill.  Rx has been requested, sent to Dr Hilda Lias, pending.

## 2019-09-30 ENCOUNTER — Telehealth: Payer: Self-pay | Admitting: Orthopaedic Surgery

## 2019-09-30 MED ORDER — TRAMADOL HCL 50 MG PO TABS: ORAL_TABLET | ORAL | 0 refills | Status: DC

## 2019-09-30 NOTE — Telephone Encounter (Signed)
Patient requests refill on Tramadol 50 mgs.  Qty  60  Sig: TAKE 1 TABLET BY MOUTH EVERY 6 HOURS AS NEEDED FOR PAIN  Patient states he uses [facility] in [place]

## 2019-10-07 ENCOUNTER — Other Ambulatory Visit: Payer: Self-pay | Admitting: Orthopaedic Surgery

## 2019-10-15 ENCOUNTER — Encounter: Payer: Self-pay | Admitting: Orthopaedic Surgery

## 2019-10-15 ENCOUNTER — Ambulatory Visit (INDEPENDENT_AMBULATORY_CARE_PROVIDER_SITE_OTHER): Payer: Self-pay | Admitting: Orthopaedic Surgery

## 2019-10-15 ENCOUNTER — Other Ambulatory Visit: Payer: Self-pay

## 2019-10-15 VITALS — BP 133/81 | HR 63 | Temp 97.0°F | Ht 60.0 in | Wt 222.5 lb

## 2019-10-15 DIAGNOSIS — G8929 Other chronic pain: Secondary | ICD-10-CM

## 2019-10-15 DIAGNOSIS — M5442 Lumbago with sciatica, left side: Secondary | ICD-10-CM

## 2019-10-15 DIAGNOSIS — M5441 Lumbago with sciatica, right side: Secondary | ICD-10-CM

## 2019-10-15 MED ORDER — TIZANIDINE HCL 4 MG PO TABS: ORAL_TABLET | ORAL | 0 refills | Status: DC

## 2019-10-15 NOTE — Progress Notes (Signed)
Patient Shawn Pittman Shawn Pittman, male DOB:14-Mar-1966, 54 y.o. PPJ:[id number]  Chief Complaint  Patient presents with  . Back Pain    Good days and bad days/pain down left leg today    HPI  Shawn Pittman is a 54 y.o. male who has chronic lower back pain.  He has spasm at night of the calves.  He takes mustard for this.  I will give Zanaflex.  He has no new trauma.  He is taking his pain medicine and doing his exercises.  He has more pain after a very active day.   Body mass index is 43.45 kg/m.  ROS  Review of Systems  Constitutional: Positive for activity change.  Eyes:       False eye right  Musculoskeletal: Positive for arthralgias and back pain.  All other systems reviewed and are negative.   All other systems reviewed and are negative.  The following is a summary of the past history medically, past history surgically, known current medicines, social history and family history.  This information is gathered electronically by the computer from prior information and documentation.  I review this each visit and have found including this information at this point in the chart is beneficial and informative.    Past Medical History:  Diagnosis Date  . Back pain, chronic   . Hyperlipidemia   . Hypertension   . MRSA (methicillin resistant Staphylococcus aureus)     Past Surgical History:  Procedure Laterality Date  . EYE SURGERY  1989   removed R eye    Family History  Problem Relation Age of Onset  . Heart attack Mother   . Cancer Father     Social History Social History   Tobacco Use  . Smoking status: Never Smoker  . Smokeless tobacco: Never Used  Substance Use Topics  . Alcohol use: Not Currently    Comment: no ETOH since 2000  . Drug use: Not Currently    Types: Marijuana, Cocaine    Comment: none since 1998    No Known Allergies  Current Outpatient Medications  Medication Sig Dispense Refill  . atorvastatin (LIPITOR) 20 MG tablet Take 1 tablet (20 mg  total) by mouth daily. 30 tablet 4  . lisinopril (ZESTRIL) 20 MG tablet Take 2 tablets (40 mg total) by mouth daily. 60 tablet 4  . meclizine (ANTIVERT) 25 MG tablet Take 1 tablet (25 mg total) by mouth 3 (three) times daily as needed for dizziness. (Patient not taking: Reported on 08/05/2019) 30 tablet 1  . Omega-3 Fatty Acids (FISH OIL PO) Take 1 capsule by mouth every other day.     Marland Kitchen tiZANidine (ZANAFLEX) 4 MG tablet One by mouth every night before bed as needed for spasm 30 tablet 0  . traMADol (ULTRAM) 50 MG tablet TAKE 1 TABLET BY MOUTH EVERY 6 HOURS AS NEEDED FOR PAIN 40 tablet 3   No current facility-administered medications for this visit.     Physical Exam  Blood pressure 133/81, pulse 63, temperature (!) 97 F (36.1 C), height 5' (1.524 m), weight 222 lb 8 oz (100.9 kg).  Constitutional: overall normal hygiene, normal nutrition, well developed, normal grooming, normal body habitus. Assistive device:none  Musculoskeletal: gait and station Limp none, muscle tone and strength are normal, no tremors or atrophy is present.  .  Neurological: coordination overall normal.  Deep tendon reflex/nerve stretch intact.  Sensation normal.  Cranial nerves II-XII intact.   Skin:   Normal overall no scars, lesions, ulcers or  rashes. No psoriasis.  Psychiatric: Alert and oriented x 3.  Recent memory intact, remote memory unclear.  Normal mood and affect. Well groomed.  Good eye contact.  Cardiovascular: overall no swelling, no varicosities, no edema bilaterally, normal temperatures of the legs and arms, no clubbing, cyanosis and good capillary refill.  Lymphatic: palpation is normal.  Spine/Pelvis examination:  Inspection:  Overall, sacoiliac joint benign and hips nontender; without crepitus or defects.   Thoracic spine inspection: Alignment normal without kyphosis present   Lumbar spine inspection:  Alignment  with normal lumbar lordosis, without scoliosis apparent.   Thoracic spine  palpation:  without tenderness of spinal processes   Lumbar spine palpation: without tenderness of lumbar area; without tightness of lumbar muscles    Range of Motion:   Lumbar flexion, forward flexion is normal without pain or tenderness    Lumbar extension is full without pain or tenderness   Left lateral bend is normal without pain or tenderness   Right lateral bend is normal without pain or tenderness   Straight leg raising is normal  Strength & tone: normal   Stability overall normal stability  All other systems reviewed and are negative   The patient has been educated about the nature of the problem(s) and counseled on treatment options.  The patient appeared to understand what I have discussed and is in agreement with it.  Encounter Diagnosis  Name Primary?  . Chronic bilateral low back pain with bilateral sciatica Yes    PLAN Call if any problems.  Precautions discussed.  Continue current medications. Add Zanaflex.  Return to clinic 3 months   Electronically Signed Sanjuana Kava, MD 3/23/20211:40 PM

## 2019-11-04 ENCOUNTER — Other Ambulatory Visit (HOSPITAL_COMMUNITY)
Admission: RE | Admit: 2019-11-04 | Discharge: 2019-11-04 | Disposition: A | Discharge status: Home / Self Care | Payer: Self-pay | Source: Ambulatory Visit | Attending: Physician Assistant | Admitting: Physician Assistant

## 2019-11-04 DIAGNOSIS — E785 Hyperlipidemia, unspecified: Secondary | ICD-10-CM | POA: Insufficient documentation

## 2019-11-04 DIAGNOSIS — I10 Essential (primary) hypertension: Secondary | ICD-10-CM | POA: Insufficient documentation

## 2019-11-04 LAB — COMPREHENSIVE METABOLIC PANEL
ALT: 21 U/L (ref 0–44)
AST: 20 U/L (ref 15–41)
Albumin: 4.1 g/dL (ref 3.5–5.0)
Alkaline Phosphatase: 61 U/L (ref 38–126)
Anion gap: 6 (ref 5–15)
BUN: 17 mg/dL (ref 6–20)
CO2: 29 mmol/L (ref 22–32)
Calcium: 9 mg/dL (ref 8.9–10.3)
Chloride: 105 mmol/L (ref 98–111)
Creatinine, Ser: 0.84 mg/dL (ref 0.61–1.24)
GFR calc Af Amer: >60 mL/min (ref >60)
GFR calc non Af Amer: >60 mL/min (ref >60)
Glucose, Bld: 123 mg/dL — ABNORMAL HIGH (ref 70–99)
Potassium: 5 mmol/L (ref 3.5–5.1)
Sodium: 140 mmol/L (ref 135–145)
Total Bilirubin: 0.4 mg/dL (ref 0.3–1.2)
Total Protein: 7.3 g/dL (ref 6.5–8.1)

## 2019-11-04 LAB — LIPID PANEL
Cholesterol: 164 mg/dL (ref 0–200)
HDL: 38 mg/dL — ABNORMAL LOW (ref >40)
LDL Cholesterol: 96 mg/dL (ref 0–99)
Total CHOL/HDL Ratio: 4.3 RATIO
Triglycerides: 151 mg/dL — ABNORMAL HIGH (ref <150)
VLDL: 30 mg/dL (ref 0–40)

## 2019-11-05 ENCOUNTER — Ambulatory Visit: Payer: Self-pay | Admitting: Physician Assistant

## 2019-11-06 ENCOUNTER — Ambulatory Visit: Payer: Self-pay | Admitting: Physician Assistant

## 2019-11-06 ENCOUNTER — Encounter: Payer: Self-pay | Admitting: Physician Assistant

## 2019-11-06 ENCOUNTER — Other Ambulatory Visit: Payer: Self-pay

## 2019-11-06 VITALS — BP 128/80 | HR 76 | Temp 97.9°F | Wt 231.0 lb

## 2019-11-06 DIAGNOSIS — I10 Essential (primary) hypertension: Secondary | ICD-10-CM

## 2019-11-06 DIAGNOSIS — Z2821 Immunization not carried out because of patient refusal: Secondary | ICD-10-CM

## 2019-11-06 DIAGNOSIS — E785 Hyperlipidemia, unspecified: Secondary | ICD-10-CM

## 2019-11-06 DIAGNOSIS — I712 Thoracic aortic aneurysm, without rupture, unspecified: Secondary | ICD-10-CM

## 2019-11-06 DIAGNOSIS — Z1211 Encounter for screening for malignant neoplasm of colon: Secondary | ICD-10-CM

## 2019-11-06 NOTE — Progress Notes (Signed)
BP 128/80   Pulse 76   Temp 97.9 F (36.6 C)   Wt 231 lb (104.8 kg)   SpO2 96%   BMI 45.11 kg/m    Subjective:    Patient ID: Shawn Pittman, male    DOB: Dec 16, 1965, 54 y.o.   MRN: [id number]  HPI: Shawn Pittman is a 54 y.o. male presenting on 11/06/2019 for No chief complaint on file.   HPI   Pt had a negative covid 19 screening questionnaire.   Pt is 51yoM with htn and dyslipidemia.  He says he is Doing good.  He does complain of Having leg cramps sometimes.  He is Seeing orthooedist for LBP.  Pt has AA that is due for imaging 06/2020       Relevant past medical, surgical, family and social history reviewed and updated as indicated. Interim medical history since our last visit reviewed. Allergies and medications reviewed and updated.   Current Outpatient Medications:  .  atorvastatin (LIPITOR) 20 MG tablet, Take 1 tablet (20 mg total) by mouth daily., Disp: 30 tablet, Rfl: 4 .  lisinopril (ZESTRIL) 20 MG tablet, Take 2 tablets (40 mg total) by mouth daily., Disp: 60 tablet, Rfl: 4 .  traMADol (ULTRAM) 50 MG tablet, TAKE 1 TABLET BY MOUTH EVERY 6 HOURS AS NEEDED FOR PAIN, Disp: 40 tablet, Rfl: 3    Review of Systems  Per HPI unless specifically indicated above     Objective:    BP 128/80   Pulse 76   Temp 97.9 F (36.6 C)   Wt 231 lb (104.8 kg)   SpO2 96%   BMI 45.11 kg/m   Wt Readings from Last 3 Encounters:  11/06/19 231 lb (104.8 kg)  10/15/19 222 lb 8 oz (100.9 kg)  07/15/19 224 lb (101.6 kg)    Physical Exam Vitals reviewed.  Constitutional:      General: He is not in acute distress.    Appearance: Normal appearance. He is well-developed. He is not ill-appearing.  HENT:     Head: Normocephalic and atraumatic.  Cardiovascular:     Rate and Rhythm: Normal rate and regular rhythm.  Pulmonary:     Effort: Pulmonary effort is normal.     Breath sounds: Normal breath sounds. No wheezing.  Abdominal:     General: Bowel sounds are normal.      Palpations: Abdomen is soft.     Tenderness: There is no abdominal tenderness.  Musculoskeletal:     Cervical back: Neck supple.     Right lower leg: No edema.     Left lower leg: No edema.  Lymphadenopathy:     Cervical: No cervical adenopathy.  Skin:    General: Skin is warm and dry.  Neurological:     Mental Status: He is alert and oriented to person, place, and time.  Psychiatric:        Attention and Perception: Attention normal.        Speech: Speech normal.        Behavior: Behavior normal. Behavior is cooperative.     Results for orders placed or performed during the hospital encounter of 11/04/19  Lipid panel  Result Value Ref Range   Cholesterol 164 0 - 200 mg/dL   Triglycerides 151 (H) <150 mg/dL   HDL 38 (L) >40 mg/dL   Total CHOL/HDL Ratio 4.3 RATIO   VLDL 30 0 - 40 mg/dL   LDL Cholesterol 96 0 - 99 mg/dL  Comprehensive metabolic panel  Result Value Ref Range   Sodium 140 135 - 145 mmol/L   Potassium 5.0 3.5 - 5.1 mmol/L   Chloride 105 98 - 111 mmol/L   CO2 29 22 - 32 mmol/L   Glucose, Bld 123 (H) 70 - 99 mg/dL   BUN 17 6 - 20 mg/dL   Creatinine, Ser 5.57 0.61 - 1.24 mg/dL   Calcium 9.0 8.9 - 32.2 mg/dL   Total Protein 7.3 6.5 - 8.1 g/dL   Albumin 4.1 3.5 - 5.0 g/dL   AST 20 15 - 41 U/L   ALT 21 0 - 44 U/L   Alkaline Phosphatase 61 38 - 126 U/L   Total Bilirubin 0.4 0.3 - 1.2 mg/dL   GFR calc non Af Amer >60 >60 mL/min   GFR calc Af Amer >60 >60 mL/min   Anion gap 6 5 - 15      Assessment & Plan:    Encounter Diagnoses  Name Primary?  . Essential hypertension Yes  . Hyperlipidemia, unspecified hyperlipidemia type   . Thoracic aortic aneurysm without rupture (HCC)   . Screening for colon cancer   . COVID-19 virus vaccination declined      -Reviewed labs with pt -pt to Continue current medications -Pt is given ifobt for colon cancer screening -pt Declined covid vaccination -pt to follow up 3 months.  He is to contact office sooner  prn

## 2019-11-13 ENCOUNTER — Other Ambulatory Visit: Payer: Self-pay | Admitting: Physician Assistant

## 2019-11-13 DIAGNOSIS — Z1211 Encounter for screening for malignant neoplasm of colon: Secondary | ICD-10-CM

## 2019-11-13 LAB — IFOBT (OCCULT BLOOD): IFOBT: NEGATIVE

## 2019-11-20 ENCOUNTER — Other Ambulatory Visit: Payer: Self-pay | Admitting: Orthopaedic Surgery

## 2019-12-09 ENCOUNTER — Telehealth: Payer: Self-pay | Admitting: Student

## 2019-12-09 NOTE — Telephone Encounter (Signed)
Pt called and left vm on nurse's line on 12-09-19 requesting referral for anxiety and panic attacks as he is going through "some crazy crap with his family"  LPN called pt back on 12-09-19 and spoke with pt. Pt states feeling very stressed, anxious, and having panic attacks. Pt states his mother has POA of his 56 year old maternal grandfather and has now kicked his grandfather out of his grandfather's house leaving his grandfather with only a gun and his truck. Pt does not live with his grandfather or his mother but is angry toward this situation.   Pt is currently at work in [place]. Pt denies SI. Pt states "i'm ready to strangle people" when asked if he had thoughts of hurting others. Pt states he needs help.  LPN suggested pt to seek ER treatment if he felt he needed immediate attention or could seek counseling at walk in mental health outpatient clinic at [facility]. Pt states he can get out of work around 1pm and will go to [employer]. LPN did emphasize again that if he felt he needed emergency services to seek ER. Pt verbalized understanding.

## 2019-12-10 ENCOUNTER — Telehealth: Payer: Self-pay | Admitting: Student

## 2019-12-10 NOTE — Telephone Encounter (Signed)
Pt called and left vm yesterday afternoon (12-09-19 at 3:58pm) stating he went to [facility] and "it didn't work out'. Pt is requesting referral to another mental health facility.  LPN called pt back on 12-10-19 and spoke with pt. Pt is currently at work. Pt states today is a good day and being at work helps.  Pt states he went to [facility] yesterday, 12-09-19 and they were not able to see him because they stop seeing pt at 2pm and he made it there after 2pm. Pt is requesting referral/recommendation to another MH facility.  LPN gave pt information for [facility] in [place], but recommended he try getting set up with [facility] as they are this [place]'s MH facility. Pt states he will call them first and if unable to arrange anything he will call [facility]. LPN also advises pt to seek ER if he feels he needs emergency evaluation. Pt verbalized understanding.

## 2020-01-06 ENCOUNTER — Telehealth: Payer: Self-pay | Admitting: Orthopaedic Surgery

## 2020-01-07 NOTE — Telephone Encounter (Signed)
Patient called in regard to his refill traMADol (ULTRAM) 50 MG tablet 20 tablet  -[facility], [place] - states he knows that there is something going on, as he should not have to request a refill every 5 days. I began to explain about policies and the law; patient was not receptive. Asking for advice from the Dr.

## 2020-01-08 NOTE — Telephone Encounter (Signed)
Have him get his narcotic from family doctor.  No more from me

## 2020-01-09 NOTE — Telephone Encounter (Signed)
Routing to clinic supervisor to relay.

## 2020-01-14 ENCOUNTER — Encounter: Payer: Self-pay | Admitting: Orthopaedic Surgery

## 2020-01-14 ENCOUNTER — Other Ambulatory Visit: Payer: Self-pay

## 2020-01-14 ENCOUNTER — Ambulatory Visit (INDEPENDENT_AMBULATORY_CARE_PROVIDER_SITE_OTHER): Payer: Self-pay | Admitting: Orthopaedic Surgery

## 2020-01-14 VITALS — BP 117/67 | HR 55 | Ht 69.0 in | Wt 223.0 lb

## 2020-01-14 DIAGNOSIS — M5442 Lumbago with sciatica, left side: Secondary | ICD-10-CM

## 2020-01-14 DIAGNOSIS — M542 Cervicalgia: Secondary | ICD-10-CM

## 2020-01-14 DIAGNOSIS — G8929 Other chronic pain: Secondary | ICD-10-CM

## 2020-01-14 DIAGNOSIS — M5441 Lumbago with sciatica, right side: Secondary | ICD-10-CM

## 2020-01-14 MED ORDER — TRAMADOL HCL 50 MG PO TABS: ORAL_TABLET | ORAL | 5 refills | Status: DC

## 2020-01-14 NOTE — Progress Notes (Signed)
Patient GG:YIRSWN Shawn Pittman, male DOB:[birth year], 54 y.o. IOE:[id number]  Chief Complaint  Patient presents with  . Back Pain    HPI  Shawn Pittman is a 54 y.o. male who has chronic lower back pain.  He has good and bad days.  He has been cut back on his pain medicine but he needs more.  I have discussed his being on Ultram long term.  I would prefer his family doctor Rx this but I can if they will not.  He has no new trauma, no weakness.   Body mass index is 32.93 kg/m.  ROS  Review of Systems  Constitutional: Positive for activity change.  Eyes:       False eye right  Musculoskeletal: Positive for arthralgias and back pain.  All other systems reviewed and are negative.   All other systems reviewed and are negative.  The following is a summary of the past history medically, past history surgically, known current medicines, social history and family history.  This information is gathered electronically by the computer from prior information and documentation.  I review this each visit and have found including this information at this point in the chart is beneficial and informative.    Past Medical History:  Diagnosis Date  . Back pain, chronic   . Hyperlipidemia   . Hypertension   . MRSA (methicillin resistant Staphylococcus aureus)     Past Surgical History:  Procedure Laterality Date  . EYE SURGERY  1989   removed R eye    Family History  Problem Relation Age of Onset  . Heart attack Mother   . Cancer Father     Social History Social History   Tobacco Use  . Smoking status: Never Smoker  . Smokeless tobacco: Never Used  Vaping Use  . Vaping Use: Never used  Substance Use Topics  . Alcohol use: Not Currently    Comment: no ETOH since 2000  . Drug use: Not Currently    Types: Marijuana, Cocaine    Comment: none since 1998    No Known Allergies  Current Outpatient Medications  Medication Sig Dispense Refill  . atorvastatin (LIPITOR) 20 MG tablet  Take 1 tablet (20 mg total) by mouth daily. 30 tablet 4  . lisinopril (ZESTRIL) 20 MG tablet Take 2 tablets (40 mg total) by mouth daily. 60 tablet 4  . traMADol (ULTRAM) 50 MG tablet One tablet every six hours as needed for pain. 60 tablet 5   No current facility-administered medications for this visit.     Physical Exam  Blood pressure 117/67, pulse (!) 55, height 5\' 9"  (1.753 m), weight 223 lb (101.2 kg).  Constitutional: overall normal hygiene, normal nutrition, well developed, normal grooming, normal body habitus. Assistive device:none  Musculoskeletal: gait and station Limp none, muscle tone and strength are normal, no tremors or atrophy is present.  .  Neurological: coordination overall normal.  Deep tendon reflex/nerve stretch intact.  Sensation normal.  Cranial nerves II-XII intact.   Skin:   Normal overall no scars, lesions, ulcers or rashes. No psoriasis.  Psychiatric: Alert and oriented x 3.  Recent memory intact, remote memory unclear.  Normal mood and affect. Well groomed.  Good eye contact.  Cardiovascular: overall no swelling, no varicosities, no edema bilaterally, normal temperatures of the legs and arms, no clubbing, cyanosis and good capillary refill.  Lymphatic: palpation is normal.  Spine/Pelvis examination:  Inspection:  Overall, sacoiliac joint benign and hips nontender; without crepitus or defects.  Thoracic spine inspection: Alignment normal without kyphosis present   Lumbar spine inspection:  Alignment  with normal lumbar lordosis, without scoliosis apparent.   Thoracic spine palpation:  without tenderness of spinal processes   Lumbar spine palpation: without tenderness of lumbar area; without tightness of lumbar muscles    Range of Motion:   Lumbar flexion, forward flexion is normal without pain or tenderness    Lumbar extension is full without pain or tenderness   Left lateral bend is normal without pain or tenderness   Right lateral bend is  normal without pain or tenderness   Straight leg raising is normal  Strength & tone: normal   Stability overall normal stability  All other systems reviewed and are negative   The patient has been educated about the nature of the problem(s) and counseled on treatment options.  The patient appeared to understand what I have discussed and is in agreement with it.  Encounter Diagnoses  Name Primary?  . Chronic bilateral low back pain with bilateral sciatica Yes  . Cervicalgia     PLAN Call if any problems.  Precautions discussed.  Continue current medications.   Return to clinic 3 months   I have reviewed the [facility] web site prior to prescribing narcotic medicine for this patient.   Electronically Signed Shawn Kava, MD 6/22/202111:02 AM

## 2020-01-31 ENCOUNTER — Other Ambulatory Visit: Payer: Self-pay | Admitting: Physician Assistant

## 2020-02-05 ENCOUNTER — Ambulatory Visit: Payer: Self-pay | Admitting: Physician Assistant

## 2020-04-14 ENCOUNTER — Encounter: Payer: Self-pay | Admitting: Orthopaedic Surgery

## 2020-04-14 ENCOUNTER — Other Ambulatory Visit: Payer: Self-pay

## 2020-04-14 ENCOUNTER — Ambulatory Visit (INDEPENDENT_AMBULATORY_CARE_PROVIDER_SITE_OTHER): Payer: Self-pay | Admitting: Orthopaedic Surgery

## 2020-04-14 VITALS — BP 140/77 | HR 59 | Ht 70.0 in | Wt 230.0 lb

## 2020-04-14 DIAGNOSIS — M79672 Pain in left foot: Secondary | ICD-10-CM

## 2020-04-14 MED ORDER — TRAMADOL HCL 50 MG PO TABS: ORAL_TABLET | ORAL | 5 refills | Status: DC

## 2020-04-14 NOTE — Progress Notes (Signed)
[name], male DOB:[birth year], 54 y.o. GDJ:[id number]  Chief Complaint  Patient presents with  . Back Pain  . Foot Pain    HPI  Shawn Pittman is a 54 y.o. male who has chronic lower back pain.  He has good and bad days.  He is taking his medicine and doing his exercises.  He is working now.  He has no weakness.   Body mass index is 33 kg/m.  ROS  Review of Systems  Constitutional: Positive for activity change.  Eyes:       False eye right  Musculoskeletal: Positive for arthralgias and back pain.  All other systems reviewed and are negative.   All other systems reviewed and are negative.  The following is a summary of the past history medically, past history surgically, known current medicines, social history and family history.  This information is gathered electronically by the computer from prior information and documentation.  I review this each visit and have found including this information at this point in the chart is beneficial and informative.    Past Medical History:  Diagnosis Date  . Back pain, chronic   . Hyperlipidemia   . Hypertension   . MRSA (methicillin resistant Staphylococcus aureus)     Past Surgical History:  Procedure Laterality Date  . EYE SURGERY  1989   removed R eye    Family History  Problem Relation Age of Onset  . Heart attack Mother   . Cancer Father     Social History Social History   Tobacco Use  . Smoking status: Never Smoker  . Smokeless tobacco: Never Used  Vaping Use  . Vaping Use: Never used  Substance Use Topics  . Alcohol use: Not Currently    Comment: no ETOH since 2000  . Drug use: Not Currently    Types: Marijuana, Cocaine    Comment: none since 1998    No Known Allergies  Current Outpatient Medications  Medication Sig Dispense Refill  . atorvastatin (LIPITOR) 20 MG tablet Take 1 tablet by mouth once daily 30 tablet 0  . citalopram (CELEXA) 20 MG tablet Take 20 mg by mouth at bedtime.     . clonazePAM (KLONOPIN) 0.5 MG tablet Take 0.5 mg by mouth 2 (two) times daily as needed.    Marland Kitchen lisinopril (ZESTRIL) 20 MG tablet Take 2 tablets (40 mg total) by mouth daily. 60 tablet 4  . traMADol (ULTRAM) 50 MG tablet One tablet every six hours as needed for pain. 60 tablet 5   No current facility-administered medications for this visit.     Physical Exam  Blood pressure 140/77, pulse (!) 59, height 5\' 10"  (1.778 m), weight 230 lb (104.3 kg).  Constitutional: overall normal hygiene, normal nutrition, well developed, normal grooming, normal body habitus. Assistive device:none  Musculoskeletal: gait and station Limp none, muscle tone and strength are normal, no tremors or atrophy is present.  .  Neurological: coordination overall normal.  Deep tendon reflex/nerve stretch intact.  Sensation normal.  Cranial nerves II-XII intact.   Skin:   Normal overall no scars, lesions, ulcers or rashes. No psoriasis.  Psychiatric: Alert and oriented x 3.  Recent memory intact, remote memory unclear.  Normal mood and affect. Well groomed.  Good eye contact.  Cardiovascular: overall no swelling, no varicosities, no edema bilaterally, normal temperatures of the legs and arms, no clubbing, cyanosis and good capillary refill.  Lymphatic: palpation is normal.  Spine/Pelvis examination:  Inspection:  Overall, sacoiliac joint  benign and hips nontender; without crepitus or defects.   Thoracic spine inspection: Alignment normal without kyphosis present   Lumbar spine inspection:  Alignment  with normal lumbar lordosis, without scoliosis apparent.   Thoracic spine palpation:  without tenderness of spinal processes   Lumbar spine palpation: without tenderness of lumbar area; without tightness of lumbar muscles    Range of Motion:   Lumbar flexion, forward flexion is normal without pain or tenderness    Lumbar extension is full without pain or tenderness   Left lateral bend is normal without pain or  tenderness   Right lateral bend is normal without pain or tenderness   Straight leg raising is normal  Strength & tone: normal   Stability overall normal stability  All other systems reviewed and are negative   The patient has been educated about the nature of the problem(s) and counseled on treatment options.  The patient appeared to understand what I have discussed and is in agreement with it.  Encounter Diagnosis  Name Primary?  . Pain in left foot Yes    PLAN Call if any problems.  Precautions discussed.  Continue current medications.   Return to clinic 3 months   I have reviewed the [facility] Controlled Substance Reporting System web site prior to prescribing narcotic medicine for this patient.   Electronically Signed Darreld Mclean, MD 9/21/20218:56 AM

## 2020-04-14 NOTE — Patient Instructions (Signed)
You will get a call from podiatrist office for an appointment

## 2020-05-02 ENCOUNTER — Other Ambulatory Visit: Payer: Self-pay | Admitting: Orthopaedic Surgery

## 2020-05-29 DIAGNOSIS — M25559 Pain in unspecified hip: Secondary | ICD-10-CM | POA: Insufficient documentation

## 2020-05-29 DIAGNOSIS — IMO0002 Reserved for concepts with insufficient information to code with codable children: Secondary | ICD-10-CM | POA: Insufficient documentation

## 2020-05-29 DIAGNOSIS — X503XXA Overexertion from repetitive movements, initial encounter: Secondary | ICD-10-CM | POA: Insufficient documentation

## 2020-05-29 DIAGNOSIS — Y93H2 Activity, gardening and landscaping: Secondary | ICD-10-CM | POA: Insufficient documentation

## 2020-05-29 DIAGNOSIS — X500XXA Overexertion from strenuous movement or load, initial encounter: Secondary | ICD-10-CM | POA: Insufficient documentation

## 2020-05-29 DIAGNOSIS — S335XXA Sprain of ligaments of lumbar spine, initial encounter: Secondary | ICD-10-CM | POA: Insufficient documentation

## 2020-07-07 ENCOUNTER — Ambulatory Visit (INDEPENDENT_AMBULATORY_CARE_PROVIDER_SITE_OTHER): Payer: Self-pay | Admitting: Orthopaedic Surgery

## 2020-07-07 ENCOUNTER — Encounter: Payer: Self-pay | Admitting: Orthopaedic Surgery

## 2020-07-07 ENCOUNTER — Other Ambulatory Visit: Payer: Self-pay

## 2020-07-07 VITALS — BP 143/75 | HR 55 | Ht 70.0 in | Wt 230.0 lb

## 2020-07-07 DIAGNOSIS — M5441 Lumbago with sciatica, right side: Secondary | ICD-10-CM

## 2020-07-07 DIAGNOSIS — M5442 Lumbago with sciatica, left side: Secondary | ICD-10-CM

## 2020-07-07 DIAGNOSIS — G8929 Other chronic pain: Secondary | ICD-10-CM

## 2020-07-07 MED ORDER — TRAMADOL HCL 50 MG PO TABS: ORAL_TABLET | ORAL | 4 refills | Status: DC

## 2020-07-07 NOTE — Progress Notes (Signed)
Patient UR:KYHCWC Shawn Pittman, male DOB:07-Dec-1965, 54 y.o. BJS:[id number]  Chief Complaint  Patient presents with  . Back Pain    Lower back x 20 years,     HPI  Shawn Pittman is a 54 y.o. male who has chronic lower back pain.  He has good and bad days.  Cold weather makes it worse.  He has no numbness or new trauma.  Tramadol helps his pain.  I will refill.   Body mass index is 33 kg/m.  ROS  Review of Systems  Constitutional: Positive for activity change.  Eyes:       False eye right  Musculoskeletal: Positive for arthralgias and back pain.  All other systems reviewed and are negative.   All other systems reviewed and are negative.  The following is a summary of the past history medically, past history surgically, known current medicines, social history and family history.  This information is gathered electronically by the computer from prior information and documentation.  I review this each visit and have found including this information at this point in the chart is beneficial and informative.    Past Medical History:  Diagnosis Date  . Back pain, chronic   . Hyperlipidemia   . Hypertension   . MRSA (methicillin resistant Staphylococcus aureus)     Past Surgical History:  Procedure Laterality Date  . EYE SURGERY  1989   removed R eye    Family History  Problem Relation Age of Onset  . Heart attack Mother   . Cancer Father     Social History Social History   Tobacco Use  . Smoking status: Never Smoker  . Smokeless tobacco: Never Used  Vaping Use  . Vaping Use: Never used  Substance Use Topics  . Alcohol use: Not Currently    Comment: no ETOH since 2000  . Drug use: Not Currently    Types: Marijuana, Cocaine    Comment: none since 1998    No Known Allergies  Current Outpatient Medications  Medication Sig Dispense Refill  . atorvastatin (LIPITOR) 20 MG tablet Take 1 tablet by mouth once daily 30 tablet 0  . citalopram (CELEXA) 20 MG tablet Take  20 mg by mouth at bedtime.    . clonazePAM (KLONOPIN) 0.5 MG tablet Take 0.5 mg by mouth 2 (two) times daily as needed.    Marland Kitchen lisinopril (ZESTRIL) 20 MG tablet Take 2 tablets (40 mg total) by mouth daily. 60 tablet 4  . traMADol (ULTRAM) 50 MG tablet TAKE 1 TABLET BY MOUTH EVERY 6 HOURS AS NEEDED FOR PAIN 60 tablet 4   No current facility-administered medications for this visit.     Physical Exam  Blood pressure (!) 143/75, pulse (!) 55, height 5\' 10"  (1.778 m), weight 230 lb (104.3 kg).  Constitutional: overall normal hygiene, normal nutrition, well developed, normal grooming, normal body habitus. Assistive device:none  Musculoskeletal: gait and station Limp none, muscle tone and strength are normal, no tremors or atrophy is present.  .  Neurological: coordination overall normal.  Deep tendon reflex/nerve stretch intact.  Sensation normal.  Cranial nerves II-XII intact.   Skin:   Normal overall no scars, lesions, ulcers or rashes. No psoriasis.  Psychiatric: Alert and oriented x 3.  Recent memory intact, remote memory unclear.  Normal mood and affect. Well groomed.  Good eye contact.  Cardiovascular: overall no swelling, no varicosities, no edema bilaterally, normal temperatures of the legs and arms, no clubbing, cyanosis and good capillary refill.  Lymphatic:  palpation is normal.  Spine/Pelvis examination:  Inspection:  Overall, sacoiliac joint benign and hips nontender; without crepitus or defects.   Thoracic spine inspection: Alignment normal without kyphosis present   Lumbar spine inspection:  Alignment  with normal lumbar lordosis, without scoliosis apparent.   Thoracic spine palpation:  without tenderness of spinal processes   Lumbar spine palpation: without tenderness of lumbar area; without tightness of lumbar muscles    Range of Motion:   Lumbar flexion, forward flexion is normal without pain or tenderness    Lumbar extension is full without pain or  tenderness   Left lateral bend is normal without pain or tenderness   Right lateral bend is normal without pain or tenderness   Straight leg raising is normal  Strength & tone: normal   Stability overall normal stability  All other systems reviewed and are negative   The patient has been educated about the nature of the problem(s) and counseled on treatment options.  The patient appeared to understand what I have discussed and is in agreement with it.  Encounter Diagnosis  Name Primary?  . Chronic bilateral low back pain with bilateral sciatica Yes    PLAN Call if any problems.  Precautions discussed.  Continue current medications.   Return to clinic 3 months   I have reviewed the [facility] Controlled Substance Reporting System web site prior to prescribing narcotic medicine for this patient.   Electronically Signed Darreld Mclean, MD 12/14/20219:17 AM

## 2020-07-14 ENCOUNTER — Ambulatory Visit: Payer: Self-pay | Admitting: Orthopaedic Surgery

## 2020-07-20 ENCOUNTER — Other Ambulatory Visit: Payer: Self-pay | Admitting: Orthopaedic Surgery

## 2020-09-03 IMAGING — DX DG ORBITS FOR FOREIGN BODY
2 series · 2 of 2 positions shown · non-contrast
Comparison: Head CT 08/03/2011

CLINICAL DATA: Metal working/exposure; clearance prior to MRI.
Surgical history of right eye removal.

EXAM:
ORBITS FOR FOREIGN BODY - 2 VIEW

[orbits lat (1 of 2)]
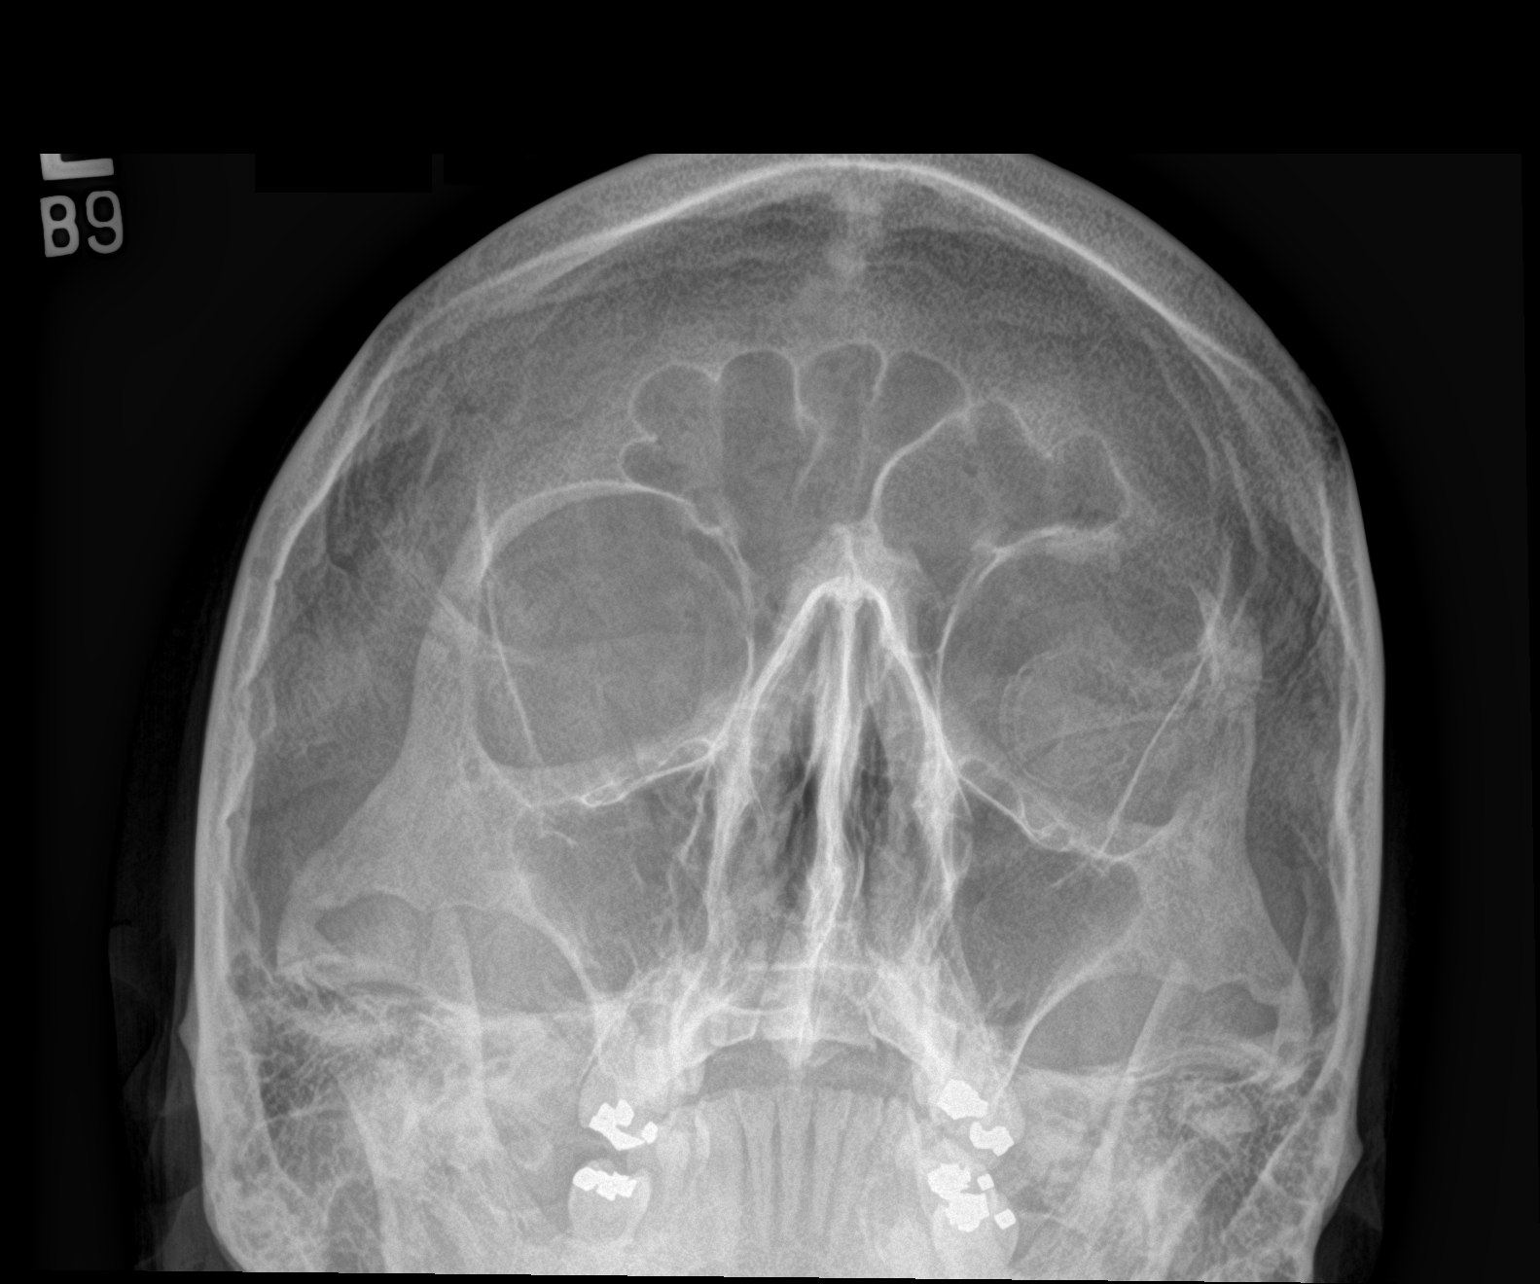

[orbits lat (2 of 2)]
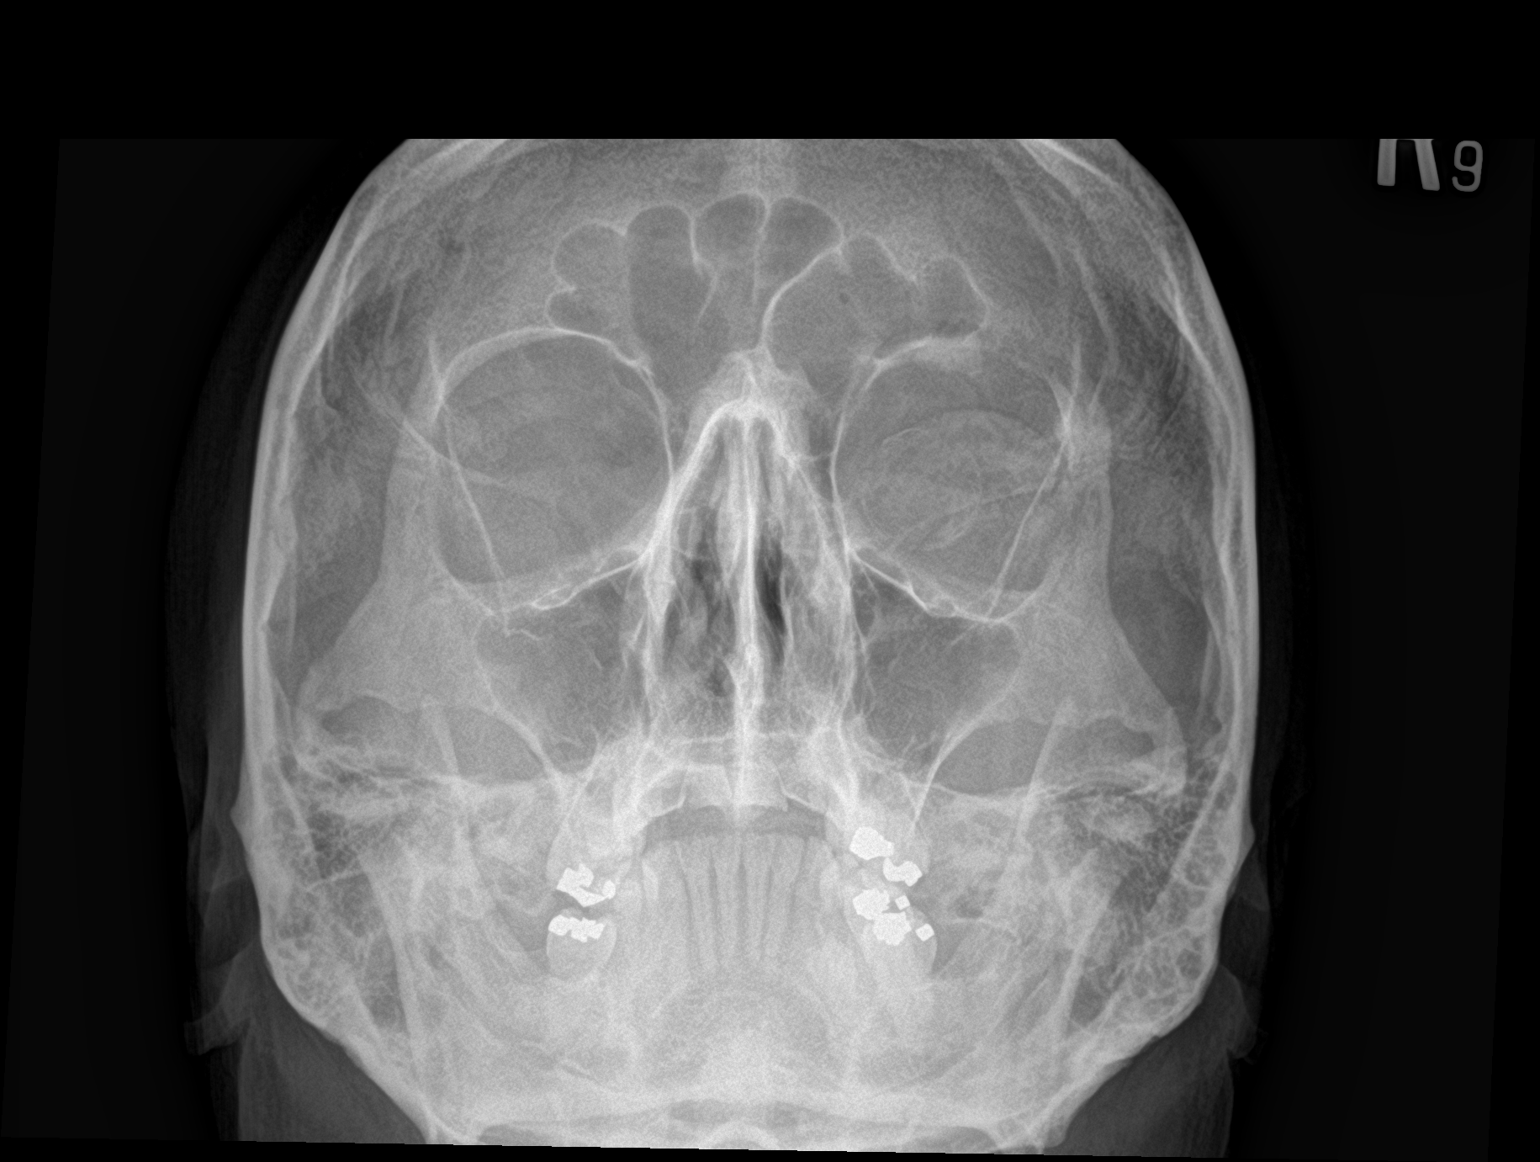

[2 of 2 positions shown; findings below may reference images not displayed]

FINDINGS: Again noted is a small round dense structure in the right orbit
compatible with a right globe prosthesis based on the history of
prior surgery. Otherwise, no evidence for a metallic foreign body in
the orbits. There is bilateral dental hardware. Visualized sinuses
are clear.
IMPRESSION: Evidence for a right globe prosthesis. Otherwise, no evidence for a
metallic foreign body in the orbits.

## 2020-09-03 IMAGING — MR MR LUMBAR SPINE W/O CM
4 of 5 series · 15 of 48 positions shown · non-contrast
Comparison: MRI lumbar spine 04/02/2011. Plain films lumbar spine
03/15/2018.

CLINICAL DATA: Chronic low back pain radiating into both legs. No
known injury.

EXAM:
MRI LUMBAR SPINE WITHOUT CONTRAST
TECHNIQUE: Multiplanar, multisequence MR imaging of the lumbar spine was
performed. No intravenous contrast was administered.

[Series 3: T2 · sagittal · 4.0mm · 0.78mm/px · 6 of 15 slices shown (1 of 2)]
[im 1/15]
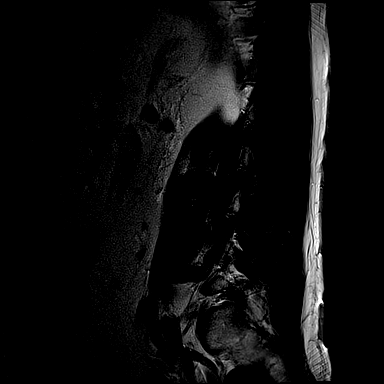
[im 3/15]
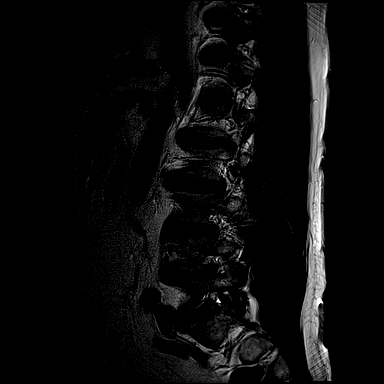
[im 6/15]
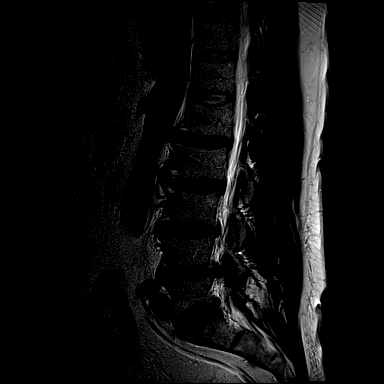
[im 9/15]
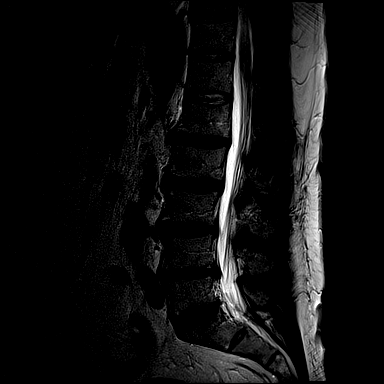
[im 12/15]
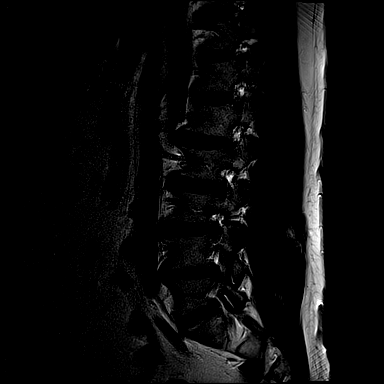
[im 15/15]
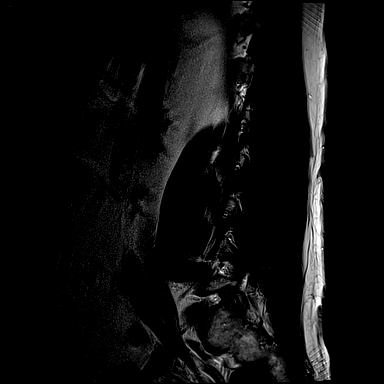

[Series 4: T1 · sagittal · 4.0mm · 0.39mm/px · 3 of 15 slices shown (1 of 2)]
[im 3/15]
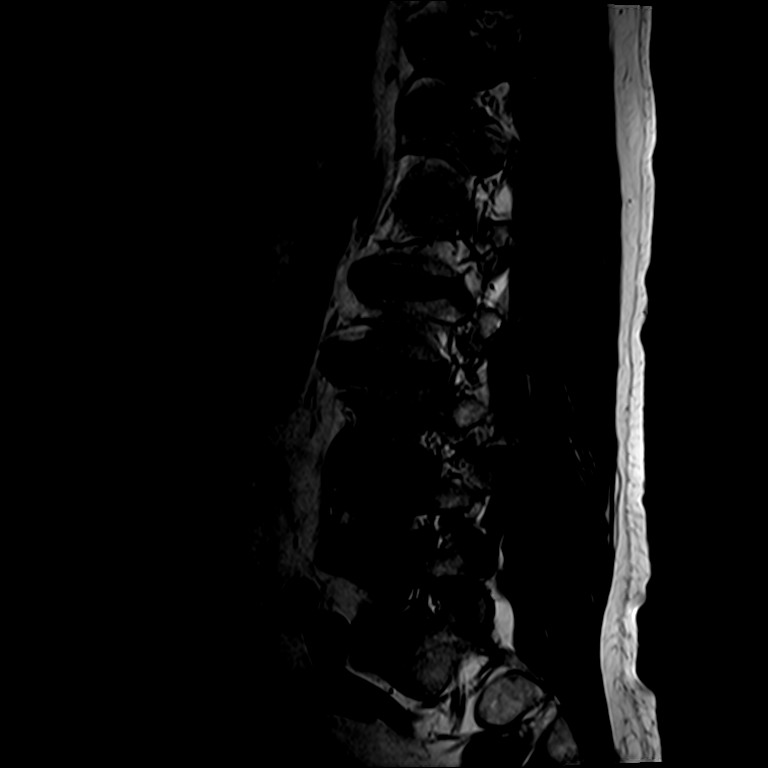
[im 9/15]
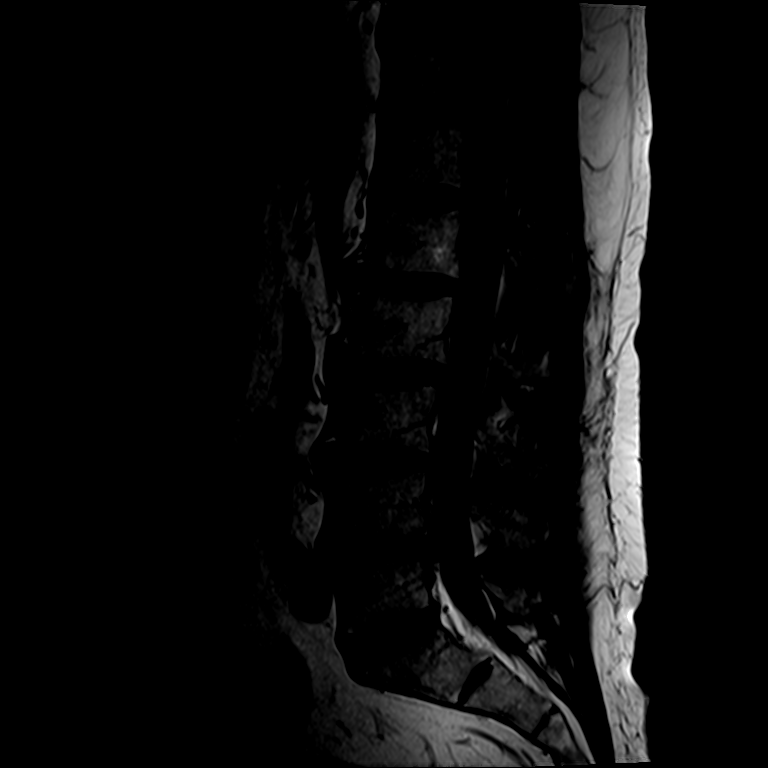
[im 15/15]
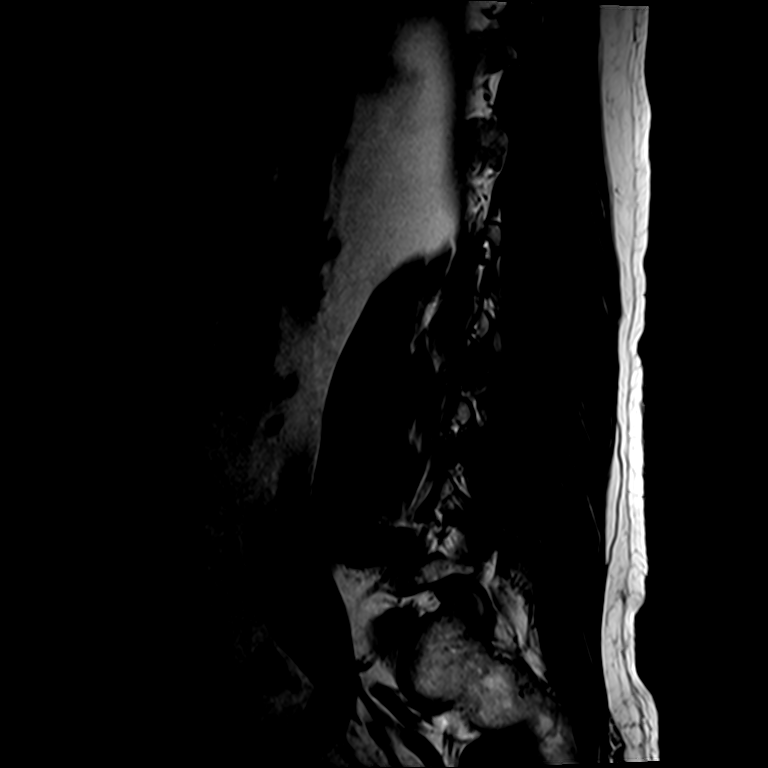

[Series 6: T2 · axial · 4.0mm · 0.21mm/px · z∈[-96,+31]mm · 3 of 37 slices shown (2 of 2)]
[im 6/37]
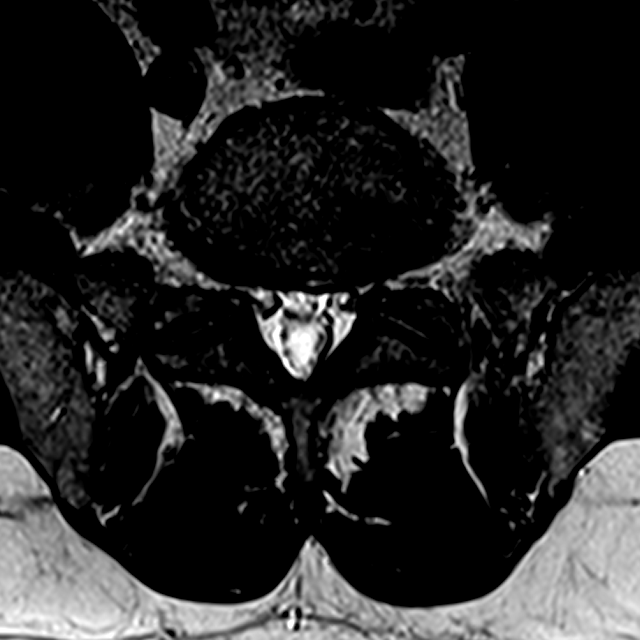
[im 19/37]
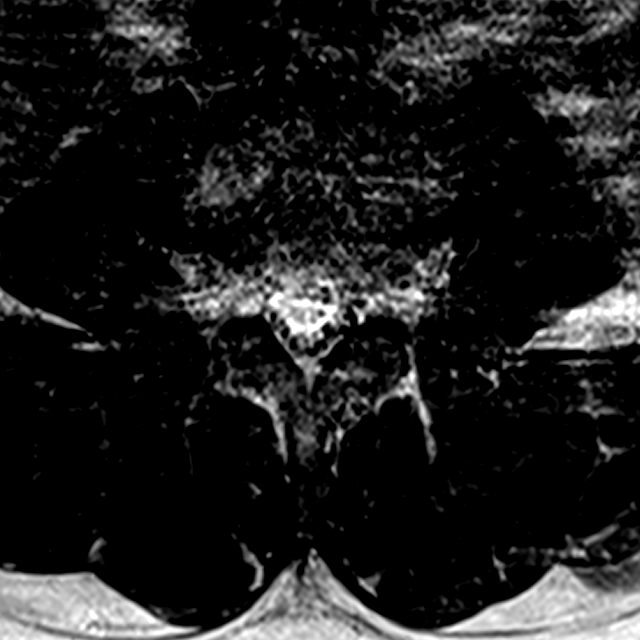
[im 31/37]
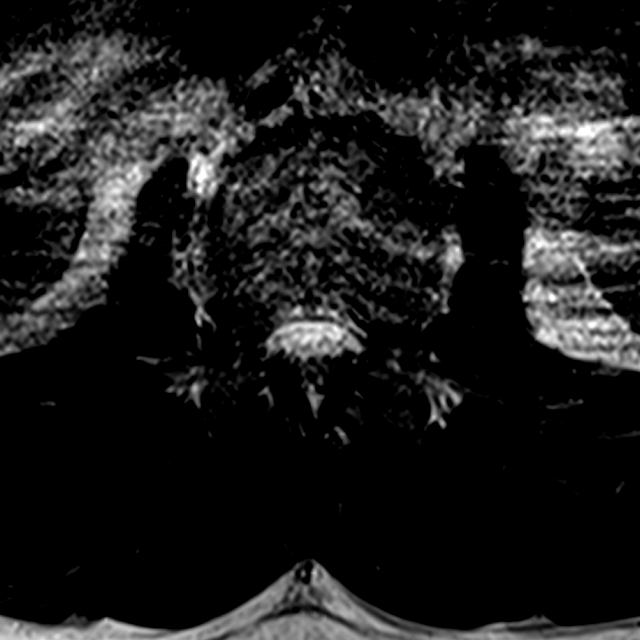

[Series 7: T1 · axial · 4.0mm · 0.21mm/px · z∈[-96,+31]mm · 3 of 37 slices shown (2 of 2)]
[im 6/37]
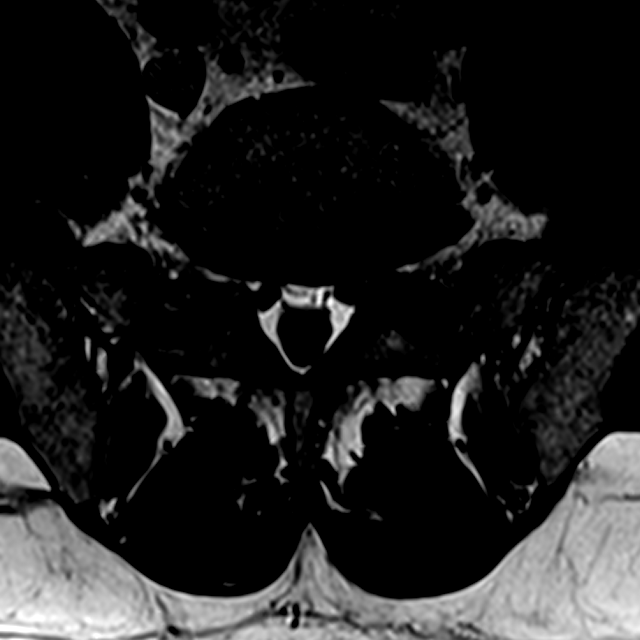
[im 19/37]
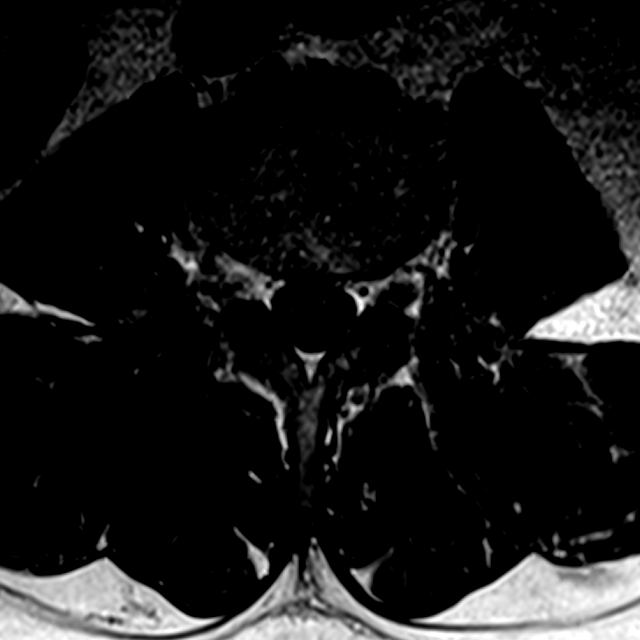
[im 31/37]
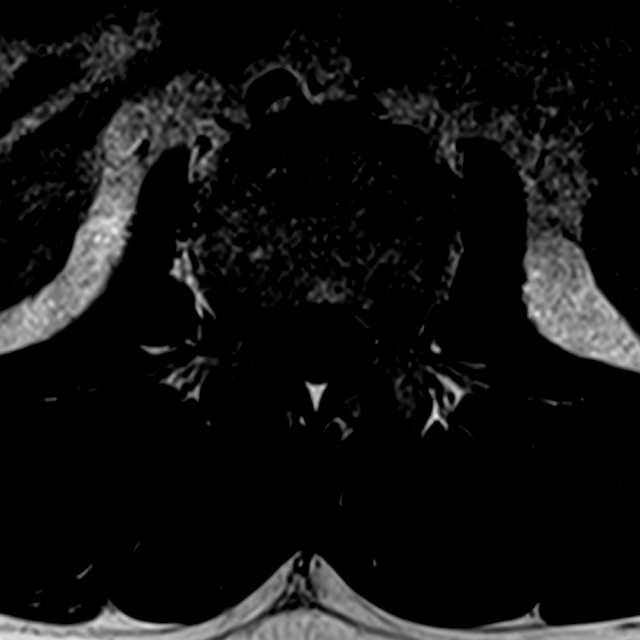

[15 of 48 positions shown; findings below may reference images not displayed]

FINDINGS: Segmentation:  Standard.

Alignment:  Maintained.

Vertebrae:  No fracture or worrisome lesion.

Conus medullaris and cauda equina: Conus extends to the T12 level.
Conus and cauda equina appear normal.

Paraspinal and other soft tissues: Negative.

Disc levels:

The axial images are degraded by patient motion.

T10-11, T11-12 and T12-L1 are imaged in the sagittal plane only and
negative.

L1-2: Mild loss of disc space height and a shallow bulge without
stenosis.

L2-3: Small protrusion in the far periphery of the right foramen is
unchanged. No central canal or foraminal stenosis.

L3-4: Shallow disc bulge and a superimposed left paracentral
protrusion with slight caudal extension are identified. There is
mild central canal and bilateral foraminal narrowing. Mild narrowing
is also present in the left lateral recess. Degenerative disease has
mildly progressed since the prior exam.

L4-5: Mild to moderate facet degenerative disease. There is a
broad-based central and slightly eccentric to the left disc
protrusion with some caudal extension. The disc causes moderate
central canal stenosis and there is narrowing of both subarticular
recesses with encroachment on the descending L5 roots. The right
foramen is open. There is mild left foraminal narrowing. The
appearance is unchanged.

L5-S1: Shallow disc bulge and mild facet degenerative disease. The
central canal is widely patent. Mild to moderate bilateral foraminal
narrowing appears worse than on the prior MRI.
IMPRESSION: Some progression of degenerative disease at L3-4 where there is mild
central canal and left lateral recess narrowing due to a shallow
disc bulge and small left paracentral protrusion. Mild bilateral
foraminal narrowing is also present at this level.

No change in moderate central canal stenosis and narrowing in both
subarticular recesses at L4-5. There is encroachment on the
descending L5 roots. Mild left foraminal narrowing at this level is
noted.

Mild to moderate bilateral foraminal narrowing at L5-S1 has
increased since the prior examination. The central canal is widely
patent at this level.

## 2020-09-30 ENCOUNTER — Telehealth: Payer: Self-pay | Admitting: Orthopaedic Surgery

## 2020-09-30 MED ORDER — TRAMADOL HCL 50 MG PO TABS: 50.0000 mg | ORAL_TABLET | Freq: Four times a day (QID) | ORAL | 4 refills | Status: DC | PRN

## 2020-09-30 NOTE — Telephone Encounter (Signed)
Patient called and request refill for   traMADol (ULTRAM) 50 MG tablet  Pharmacy: [facility] in [place]

## 2020-10-06 ENCOUNTER — Other Ambulatory Visit: Payer: Self-pay

## 2020-10-06 ENCOUNTER — Ambulatory Visit (INDEPENDENT_AMBULATORY_CARE_PROVIDER_SITE_OTHER): Payer: 59 | Admitting: Orthopaedic Surgery

## 2020-10-06 ENCOUNTER — Encounter: Payer: Self-pay | Admitting: Orthopaedic Surgery

## 2020-10-06 VITALS — BP 135/85 | HR 60 | Ht 70.0 in | Wt 238.0 lb

## 2020-10-06 DIAGNOSIS — M5441 Lumbago with sciatica, right side: Secondary | ICD-10-CM | POA: Diagnosis not present

## 2020-10-06 DIAGNOSIS — M5442 Lumbago with sciatica, left side: Secondary | ICD-10-CM

## 2020-10-06 DIAGNOSIS — G8929 Other chronic pain: Secondary | ICD-10-CM

## 2020-10-06 NOTE — Progress Notes (Signed)
Patient Shawn Pittman, male DOB:17-Jan-1966, 55 y.o. WIO:[id number]  Chief Complaint  Patient presents with  . Back Pain    Worked on floor this weekend and that aggravated my back some.     HPI  Shawn Pittman is a 55 y.o. male who has lower back pain.  He helped lay a floor last week and his back is more tender today.  He has no weakness.  He is taking his medicine and doing his exercises.     Body mass index is 34.15 kg/m.  ROS  Review of Systems  Constitutional: Positive for activity change.  Eyes:       False eye right  Musculoskeletal: Positive for arthralgias and back pain.  All other systems reviewed and are negative.   All other systems reviewed and are negative.  The following is a summary of the past history medically, past history surgically, known current medicines, social history and family history.  This information is gathered electronically by the computer from prior information and documentation.  I review this each visit and have found including this information at this point in the chart is beneficial and informative.    Past Medical History:  Diagnosis Date  . Back pain, chronic   . Hyperlipidemia   . Hypertension   . MRSA (methicillin resistant Staphylococcus aureus)     Past Surgical History:  Procedure Laterality Date  . EYE SURGERY  1989   removed R eye    Family History  Problem Relation Age of Onset  . Heart attack Mother   . Cancer Father     Social History Social History   Tobacco Use  . Smoking status: Never Smoker  . Smokeless tobacco: Never Used  Vaping Use  . Vaping Use: Never used  Substance Use Topics  . Alcohol use: Not Currently    Comment: no ETOH since 2000  . Drug use: Not Currently    Types: Marijuana, Cocaine    Comment: none since 1998    No Known Allergies  Current Outpatient Medications  Medication Sig Dispense Refill  . atorvastatin (LIPITOR) 20 MG tablet Take 1 tablet by mouth once daily 30 tablet  0  . citalopram (CELEXA) 20 MG tablet Take 20 mg by mouth at bedtime.    . clonazePAM (KLONOPIN) 0.5 MG tablet Take 0.5 mg by mouth 2 (two) times daily as needed.    Marland Kitchen lisinopril (ZESTRIL) 20 MG tablet Take 2 tablets (40 mg total) by mouth daily. 60 tablet 4  . traMADol (ULTRAM) 50 MG tablet Take 1 tablet (50 mg total) by mouth every 6 (six) hours as needed. for pain 60 tablet 4   No current facility-administered medications for this visit.     Physical Exam  Blood pressure 135/85, pulse 60, height 5\' 10"  (1.778 m), weight 238 lb (108 kg).  Constitutional: overall normal hygiene, normal nutrition, well developed, normal grooming, normal body habitus. Assistive device:none  Musculoskeletal: gait and station Limp none, muscle tone and strength are normal, no tremors or atrophy is present.  .  Neurological: coordination overall normal.  Deep tendon reflex/nerve stretch intact.  Sensation normal.  Cranial nerves II-XII intact.   Skin:   Normal overall no scars, lesions, ulcers or rashes. No psoriasis.  Psychiatric: Alert and oriented x 3.  Recent memory intact, remote memory unclear.  Normal mood and affect. Well groomed.  Good eye contact.  Cardiovascular: overall no swelling, no varicosities, no edema bilaterally, normal temperatures of the legs and arms,  no clubbing, cyanosis and good capillary refill.  Lymphatic: palpation is normal.  Spine/Pelvis examination:  Inspection:  Overall, sacoiliac joint benign and hips nontender; without crepitus or defects.   Thoracic spine inspection: Alignment normal without kyphosis present   Lumbar spine inspection:  Alignment  with normal lumbar lordosis, without scoliosis apparent.   Thoracic spine palpation:  without tenderness of spinal processes   Lumbar spine palpation: without tenderness of lumbar area; without tightness of lumbar muscles    Range of Motion:   Lumbar flexion, forward flexion is normal without pain or  tenderness    Lumbar extension is full without pain or tenderness   Left lateral bend is normal without pain or tenderness   Right lateral bend is normal without pain or tenderness   Straight leg raising is normal  Strength & tone: normal   Stability overall normal stability  All other systems reviewed and are negative   The patient has been educated about the nature of the problem(s) and counseled on treatment options.  The patient appeared to understand what I have discussed and is in agreement with it.  Encounter Diagnosis  Name Primary?  . Chronic bilateral low back pain with bilateral sciatica Yes    PLAN Call if any problems.  Precautions discussed.  Continue current medications.   Return to clinic 3 months   Electronically Signed Darreld Mclean, MD 3/15/20228:06 AM

## 2021-01-05 ENCOUNTER — Ambulatory Visit (INDEPENDENT_AMBULATORY_CARE_PROVIDER_SITE_OTHER): Payer: 59 | Admitting: Orthopaedic Surgery

## 2021-01-05 ENCOUNTER — Encounter: Payer: Self-pay | Admitting: Orthopaedic Surgery

## 2021-01-05 ENCOUNTER — Other Ambulatory Visit: Payer: Self-pay

## 2021-01-05 VITALS — BP 136/81 | HR 62 | Ht 70.0 in | Wt 235.1 lb

## 2021-01-05 DIAGNOSIS — M5441 Lumbago with sciatica, right side: Secondary | ICD-10-CM | POA: Diagnosis not present

## 2021-01-05 DIAGNOSIS — M5442 Lumbago with sciatica, left side: Secondary | ICD-10-CM

## 2021-01-05 DIAGNOSIS — G8929 Other chronic pain: Secondary | ICD-10-CM | POA: Diagnosis not present

## 2021-01-05 MED ORDER — TRAMADOL HCL 50 MG PO TABS: 50.0000 mg | ORAL_TABLET | Freq: Four times a day (QID) | ORAL | 4 refills | Status: DC | PRN

## 2021-01-05 NOTE — Progress Notes (Signed)
My back is more tender.  His lower back pain has had more pain recently.  He is putting down a floor and it causes pain. He has no weakness, no spasm.  He is taking his medicine and needs a refill.  I will do that.  He has good and bad days.  Spine/Pelvis examination:  Inspection:  Overall, sacoiliac joint benign and hips nontender; without crepitus or defects.   Thoracic spine inspection: Alignment normal without kyphosis present   Lumbar spine inspection:  Alignment  with normal lumbar lordosis, without scoliosis apparent.   Thoracic spine palpation:  without tenderness of spinal processes   Lumbar spine palpation: without tenderness of lumbar area; without tightness of lumbar muscles    Range of Motion:   Lumbar flexion, forward flexion is normal without pain or tenderness    Lumbar extension is full without pain or tenderness   Left lateral bend is normal without pain or tenderness   Right lateral bend is normal without pain or tenderness   Straight leg raising is normal  Strength & tone: normal   Stability overall normal stability  Encounter Diagnosis  Name Primary?   Chronic bilateral low back pain with bilateral sciatica Yes   I have reviewed the [place] Controlled Substance Reporting System web site prior to prescribing narcotic medicine for this patient.  I have refilled the Ultram.  Return in three months.  Call if any problem.  Precautions discussed.  Electronically Signed Darreld Mclean, MD 6/14/20228:17 AM

## 2021-04-06 ENCOUNTER — Encounter: Payer: Self-pay | Admitting: Orthopaedic Surgery

## 2021-04-06 ENCOUNTER — Ambulatory Visit (INDEPENDENT_AMBULATORY_CARE_PROVIDER_SITE_OTHER): Payer: 59 | Admitting: Orthopaedic Surgery

## 2021-04-06 ENCOUNTER — Other Ambulatory Visit: Payer: Self-pay

## 2021-04-06 VITALS — BP 133/79 | HR 57 | Ht 69.0 in | Wt 231.5 lb

## 2021-04-06 DIAGNOSIS — G8929 Other chronic pain: Secondary | ICD-10-CM

## 2021-04-06 DIAGNOSIS — M5441 Lumbago with sciatica, right side: Secondary | ICD-10-CM | POA: Diagnosis not present

## 2021-04-06 DIAGNOSIS — M5442 Lumbago with sciatica, left side: Secondary | ICD-10-CM | POA: Diagnosis not present

## 2021-04-06 MED ORDER — TRAMADOL HCL 50 MG PO TABS: 50.0000 mg | ORAL_TABLET | Freq: Four times a day (QID) | ORAL | 4 refills | Status: DC | PRN

## 2021-04-06 NOTE — Progress Notes (Signed)
His lower back had increased pain and sciatica about three weeks ago for several days.  He almost called to be seen but he eventually got better.  He works around 70 hours a week.  He took his medicine and did his exercises and slowly got better.  He has good and bad days. Today he has much less pain.  He has no weakness.  Spine/Pelvis examination:  Inspection:  Overall, sacoiliac joint benign and hips nontender; without crepitus or defects.   Thoracic spine inspection: Alignment normal without kyphosis present   Lumbar spine inspection:  Alignment  with normal lumbar lordosis, without scoliosis apparent.   Thoracic spine palpation:  without tenderness of spinal processes   Lumbar spine palpation: without tenderness of lumbar area; without tightness of lumbar muscles    Range of Motion:   Lumbar flexion, forward flexion is normal without pain or tenderness    Lumbar extension is full without pain or tenderness   Left lateral bend is normal without pain or tenderness   Right lateral bend is normal without pain or tenderness   Straight leg raising is normal  Strength & tone: normal   Stability overall normal stability Encounter Diagnosis  Name Primary?   Chronic bilateral low back pain with bilateral sciatica Yes   I refilled his Ultram.  He does not want to consider any surgery.  Return in three months.  Call if any problem.  Precautions discussed.  Electronically Signed Darreld Mclean, MD 9/13/20228:16 AM

## 2021-05-11 ENCOUNTER — Encounter: Payer: Self-pay | Admitting: Orthopaedic Surgery

## 2021-06-10 ENCOUNTER — Encounter: Payer: Self-pay | Admitting: Orthopedic Surgery

## 2021-07-02 ENCOUNTER — Telehealth: Payer: Self-pay | Admitting: Orthopaedic Surgery

## 2021-07-06 ENCOUNTER — Encounter: Payer: Self-pay | Admitting: Orthopaedic Surgery

## 2021-07-06 ENCOUNTER — Ambulatory Visit (INDEPENDENT_AMBULATORY_CARE_PROVIDER_SITE_OTHER): Payer: 59 | Admitting: Orthopaedic Surgery

## 2021-07-06 ENCOUNTER — Other Ambulatory Visit: Payer: Self-pay

## 2021-07-06 VITALS — BP 155/85 | HR 60 | Ht 70.0 in | Wt 239.0 lb

## 2021-07-06 DIAGNOSIS — M79642 Pain in left hand: Secondary | ICD-10-CM | POA: Diagnosis not present

## 2021-07-06 DIAGNOSIS — M5441 Lumbago with sciatica, right side: Secondary | ICD-10-CM | POA: Diagnosis not present

## 2021-07-06 DIAGNOSIS — M5442 Lumbago with sciatica, left side: Secondary | ICD-10-CM | POA: Diagnosis not present

## 2021-07-06 DIAGNOSIS — M79641 Pain in right hand: Secondary | ICD-10-CM | POA: Diagnosis not present

## 2021-07-06 DIAGNOSIS — G8929 Other chronic pain: Secondary | ICD-10-CM

## 2021-07-06 MED ORDER — NAPROXEN 500 MG PO TABS: 500.0000 mg | ORAL_TABLET | Freq: Two times a day (BID) | ORAL | 5 refills | Status: DC

## 2021-07-06 NOTE — Patient Instructions (Addendum)
Aspercreme, Biofreeze, Blue Emu or Voltaren Gel over the counter 2-3 times daily. Rub into area well each use for best results. Follow-up in 3 months, sooner if you need [place]

## 2021-07-06 NOTE — Progress Notes (Signed)
My back is tender but my hand hurt more.  He has chronic lower back pain that is stable. He has no new trauma, no weakness.  He is taking his medicine and doing his exercises.  He has hand pain more with the cold weather.  They are stiff in the mornings.  He has tried paraffin bath, rubs with some help.  He has swelling at times.  He has no numbness no trauma, no redness.  Spine/Pelvis examination:  Inspection:  Overall, sacoiliac joint benign and hips nontender; without crepitus or defects.   Thoracic spine inspection: Alignment normal without kyphosis present   Lumbar spine inspection:  Alignment  with normal lumbar lordosis, without scoliosis apparent.   Thoracic spine palpation:  without tenderness of spinal processes   Lumbar spine palpation: without tenderness of lumbar area; without tightness of lumbar muscles    Range of Motion:   Lumbar flexion, forward flexion is normal without pain or tenderness    Lumbar extension is full without pain or tenderness   Left lateral bend is normal without pain or tenderness   Right lateral bend is normal without pain or tenderness   Straight leg raising is normal  Strength & tone: normal   Stability overall normal stability  Hands have some diffuse pain and swelling.  Making a fist is difficult.  NV intact.  Encounter Diagnoses  Name Primary?   Chronic bilateral low back pain with bilateral sciatica Yes   Bilateral hand pain    I will begin Naprosyn.  Continue the rubs and the paraffin bath.  Return in two months.  Call if any problem.  Precautions discussed.  Electronically Signed Darreld Mclean, MD 12/13/20228:34 AM

## 2021-10-05 ENCOUNTER — Ambulatory Visit: Payer: 59

## 2021-10-05 ENCOUNTER — Encounter: Payer: Self-pay | Admitting: Orthopaedic Surgery

## 2021-10-05 ENCOUNTER — Ambulatory Visit (INDEPENDENT_AMBULATORY_CARE_PROVIDER_SITE_OTHER): Payer: 59 | Admitting: Orthopaedic Surgery

## 2021-10-05 ENCOUNTER — Other Ambulatory Visit: Payer: Self-pay

## 2021-10-05 VITALS — BP 136/88 | HR 78 | Ht 69.0 in | Wt 228.0 lb

## 2021-10-05 DIAGNOSIS — M5441 Lumbago with sciatica, right side: Secondary | ICD-10-CM | POA: Diagnosis not present

## 2021-10-05 DIAGNOSIS — M79642 Pain in left hand: Secondary | ICD-10-CM | POA: Diagnosis not present

## 2021-10-05 DIAGNOSIS — M79641 Pain in right hand: Secondary | ICD-10-CM

## 2021-10-05 DIAGNOSIS — G8929 Other chronic pain: Secondary | ICD-10-CM

## 2021-10-05 DIAGNOSIS — M5442 Lumbago with sciatica, left side: Secondary | ICD-10-CM

## 2021-10-05 MED ORDER — TRAMADOL HCL 50 MG PO TABS: 50.0000 mg | ORAL_TABLET | Freq: Four times a day (QID) | ORAL | 3 refills | Status: DC | PRN

## 2021-10-05 MED ORDER — DICLOFENAC SODIUM 75 MG PO TBEC: 75.0000 mg | DELAYED_RELEASE_TABLET | Freq: Two times a day (BID) | ORAL | 2 refills | Status: DC

## 2021-10-05 NOTE — Progress Notes (Signed)
My hands are worse and my back is the same. ? ?He has more pain in the hands. He has swelling of the PIP joints, morning pain, stiffness that is getting worse.  It affects both hands.  Cold weather makes his pain worse.  He gets a little better as the day goes on.  He has no trauma, no numbness, no redness. ? ?His lower back is tender but he is doing well with that considering his pain.  He has good and bad days.  He has no weakness, no new trauma. ? ?Spine/Pelvis examination: ? Inspection:  Overall, sacoiliac joint benign and hips nontender; without crepitus or defects. ? ? Thoracic spine inspection: Alignment normal without kyphosis present ? ? Lumbar spine inspection:  Alignment  with normal lumbar lordosis, without scoliosis apparent. ? ? Thoracic spine palpation:  without tenderness of spinal processes ? ? Lumbar spine palpation: without tenderness of lumbar area; without tightness of lumbar muscles  ? ? Range of Motion: ?  Lumbar flexion, forward flexion is normal without pain or tenderness  ?  Lumbar extension is full without pain or tenderness ?  Left lateral bend is normal without pain or tenderness ?  Right lateral bend is normal without pain or tenderness ?  Straight leg raising is normal ? Strength & tone: normal ? ? Stability overall normal stability ? ?Hands are tender over the PIP joints and MCP joints, more on the left long and ring and right index and long.  He cannot fully extend the little finger at PIP joint.  NV intact.  Grip is poor.  He has no redness.  He has some fusiform swelling. ? ?X-rays were done of the hands, reported separately. ? ?Encounter Diagnoses  ?Name Primary?  ? Bilateral hand pain Yes  ? Chronic bilateral low back pain with bilateral sciatica   ? ?I will stop the Naprosyn and begin diclofenac. ? ?I will get CBC and diff, RA latex, antiDNA, sed rate. ? ?I will give Zostrix 0.075% cream for bid use of hands.  Avoid eye contact. ? ?Return in one month. ? ?Call if any  problem. ? ?Precautions discussed. ? ?Electronically Signed ?Sanjuana Kava, MD ?3/14/20239:07 AM ? ?

## 2021-11-02 ENCOUNTER — Ambulatory Visit: Payer: 59 | Admitting: Orthopaedic Surgery

## 2021-11-09 ENCOUNTER — Ambulatory Visit: Payer: 59 | Admitting: Orthopaedic Surgery

## 2021-11-16 ENCOUNTER — Ambulatory Visit: Payer: 59 | Admitting: Orthopaedic Surgery

## 2021-11-16 ENCOUNTER — Encounter: Payer: Self-pay | Admitting: Orthopaedic Surgery

## 2021-11-16 VITALS — BP 177/93 | HR 66 | Ht 70.0 in | Wt 232.6 lb

## 2021-11-16 DIAGNOSIS — M79642 Pain in left hand: Secondary | ICD-10-CM | POA: Diagnosis not present

## 2021-11-16 DIAGNOSIS — M79641 Pain in right hand: Secondary | ICD-10-CM | POA: Diagnosis not present

## 2021-11-16 MED ORDER — TRAMADOL HCL 50 MG PO TABS: 50.0000 mg | ORAL_TABLET | Freq: Four times a day (QID) | ORAL | 3 refills | Status: DC | PRN

## 2021-11-16 NOTE — Progress Notes (Signed)
My hands hurt more in the mornings. ? ?He has bilateral hand pain.  He has more pain in the mornings.  He is taking the diclofenac and it helps more than the Naprosyn did.  He has no new trauma.  He is out of his Ultram. ? ?Both hands have some tenderness of the PIP joints and MCP joints but no swelling.  The right ring finger has prior old deformity.  NV intact. ? ?Encounter Diagnosis  ?Name Primary?  ? Bilateral hand pain Yes  ? ?I will refill his Ultram. ? ?Use rubs on the hands in the mornings. ? ?Return in two months. ? ?Call if any problem. ? ?Precautions discussed. ? ?Electronically Signed ?Sanjuana Kava, MD ?4/25/20238:13 AM ? ?

## 2022-02-01 ENCOUNTER — Ambulatory Visit (INDEPENDENT_AMBULATORY_CARE_PROVIDER_SITE_OTHER): Payer: 59 | Admitting: Orthopaedic Surgery

## 2022-02-01 ENCOUNTER — Encounter: Payer: Self-pay | Admitting: Orthopaedic Surgery

## 2022-02-01 VITALS — BP 151/82 | HR 69 | Ht 68.0 in | Wt 227.0 lb

## 2022-02-01 DIAGNOSIS — G8929 Other chronic pain: Secondary | ICD-10-CM

## 2022-02-01 DIAGNOSIS — M79641 Pain in right hand: Secondary | ICD-10-CM | POA: Diagnosis not present

## 2022-02-01 DIAGNOSIS — M5442 Lumbago with sciatica, left side: Secondary | ICD-10-CM

## 2022-02-01 DIAGNOSIS — M5441 Lumbago with sciatica, right side: Secondary | ICD-10-CM | POA: Diagnosis not present

## 2022-02-01 DIAGNOSIS — M79642 Pain in left hand: Secondary | ICD-10-CM

## 2022-02-01 MED ORDER — HYDROCODONE-ACETAMINOPHEN 5-325 MG PO TABS: ORAL_TABLET | ORAL | 0 refills | Status: DC

## 2022-02-01 MED ORDER — NABUMETONE 750 MG PO TABS: 750.0000 mg | ORAL_TABLET | Freq: Two times a day (BID) | ORAL | 5 refills | Status: DC

## 2022-02-01 NOTE — Progress Notes (Signed)
My hands hurt more  He is active and works for himself.  He has more pain in the hands now.  He has pain more in the morning and the mid day.  He takes tramadol and that helps little.  He has taken Aleve bid and that helps some.  I will change to Relafen 750, stop the Aleve.  I will stop the Tramadol and begin Norco 5 for a trial.  He has no new trauma.  Both hands have fusiform swelling, more pain at PIP joints, deformity of right little PIP joint from old injury.  NV intact. ROM painful and slightly decreased.  Encounter Diagnoses  Name Primary?   Bilateral hand pain Yes   Chronic bilateral low back pain with bilateral sciatica    I have reviewed the [place] Controlled Substance Reporting System web site prior to prescribing narcotic medicine for this patient.  Return in one month.  Call if any problem.  Precautions discussed.  Electronically Signed Darreld Mclean, MD 7/11/20238:54 AM

## 2022-02-07 ENCOUNTER — Telehealth: Payer: Self-pay | Admitting: Radiology

## 2022-02-07 MED ORDER — HYDROCODONE-ACETAMINOPHEN 5-325 MG PO TABS: ORAL_TABLET | ORAL | 0 refills | Status: DC

## 2022-02-16 ENCOUNTER — Telehealth: Payer: Self-pay | Admitting: Orthopaedic Surgery

## 2022-02-16 NOTE — Telephone Encounter (Signed)
Patient requests refill HYDROcodone-acetaminophen (NORCO/VICODIN) 5-325 MG tablet 25 tablet  [employer], [employer]

## 2022-02-17 MED ORDER — HYDROCODONE-ACETAMINOPHEN 5-325 MG PO TABS: ORAL_TABLET | ORAL | 0 refills | Status: DC

## 2022-02-28 ENCOUNTER — Telehealth: Payer: Self-pay

## 2022-02-28 NOTE — Telephone Encounter (Signed)
Hydrocodone-Acetaminophen 5/325 MG Qty 20 Tablets  PATIENT USES Shawn Pittman

## 2022-03-01 MED ORDER — HYDROCODONE-ACETAMINOPHEN 5-325 MG PO TABS: ORAL_TABLET | ORAL | 0 refills | Status: DC

## 2022-03-08 ENCOUNTER — Encounter: Payer: Self-pay | Admitting: Orthopaedic Surgery

## 2022-03-08 ENCOUNTER — Ambulatory Visit: Payer: 59 | Admitting: Orthopaedic Surgery

## 2022-03-08 DIAGNOSIS — M5442 Lumbago with sciatica, left side: Secondary | ICD-10-CM | POA: Diagnosis not present

## 2022-03-08 DIAGNOSIS — G8929 Other chronic pain: Secondary | ICD-10-CM

## 2022-03-08 DIAGNOSIS — M79641 Pain in right hand: Secondary | ICD-10-CM

## 2022-03-08 DIAGNOSIS — M5441 Lumbago with sciatica, right side: Secondary | ICD-10-CM

## 2022-03-08 DIAGNOSIS — M79642 Pain in left hand: Secondary | ICD-10-CM

## 2022-03-08 MED ORDER — TRAMADOL HCL 50 MG PO TABS: ORAL_TABLET | ORAL | 3 refills | Status: DC

## 2022-03-08 NOTE — Progress Notes (Signed)
My hands are a little better with the new medicine.  I had changed him to Relafen 750 and it seems to help him better than the other NSAIDs.  He has no GI problems.  He is taking it regularly.    He says the hydrocodone does not help that much and I will resume the Toradol.  His lower back is not hurting that much, he has less pain in the summer months.  His hands have some slight fusiform swelling but full ROM, NV intact, no redness.  Spine/Pelvis examination:  Inspection:  Overall, sacoiliac joint benign and hips nontender; without crepitus or defects.   Thoracic spine inspection: Alignment normal without kyphosis present   Lumbar spine inspection:  Alignment  with normal lumbar lordosis, without scoliosis apparent.   Thoracic spine palpation:  without tenderness of spinal processes   Lumbar spine palpation: without tenderness of lumbar area; without tightness of lumbar muscles    Range of Motion:   Lumbar flexion, forward flexion is normal without pain or tenderness    Lumbar extension is full without pain or tenderness   Left lateral bend is normal without pain or tenderness   Right lateral bend is normal without pain or tenderness   Straight leg raising is normal  Strength & tone: normal   Stability overall normal stability  Encounter Diagnoses  Name Primary?   Bilateral hand pain Yes   Chronic bilateral low back pain with bilateral sciatica    I have changed to Toradol.  I have reviewed the [place] Controlled Substance Reporting System web site prior to prescribing narcotic medicine for this patient.  Return in three months.  Call if any problem.  Precautions discussed.  Electronically Signed Darreld Mclean, MD 8/15/20238:04 AM

## 2022-05-25 NOTE — Telephone Encounter (Signed)
This encounter was created in error - please disregard.

## 2022-06-02 ENCOUNTER — Telehealth: Payer: Self-pay | Admitting: Orthopaedic Surgery

## 2022-06-08 ENCOUNTER — Encounter: Payer: Self-pay | Admitting: Orthopaedic Surgery

## 2022-06-08 ENCOUNTER — Ambulatory Visit (INDEPENDENT_AMBULATORY_CARE_PROVIDER_SITE_OTHER): Payer: 59 | Admitting: Orthopaedic Surgery

## 2022-06-08 VITALS — BP 152/90 | HR 86 | Ht 69.0 in | Wt 230.0 lb

## 2022-06-08 DIAGNOSIS — M5441 Lumbago with sciatica, right side: Secondary | ICD-10-CM

## 2022-06-08 DIAGNOSIS — M5442 Lumbago with sciatica, left side: Secondary | ICD-10-CM | POA: Diagnosis not present

## 2022-06-08 DIAGNOSIS — M79642 Pain in left hand: Secondary | ICD-10-CM | POA: Diagnosis not present

## 2022-06-08 DIAGNOSIS — G8929 Other chronic pain: Secondary | ICD-10-CM

## 2022-06-08 DIAGNOSIS — M79641 Pain in right hand: Secondary | ICD-10-CM | POA: Diagnosis not present

## 2022-06-08 NOTE — Progress Notes (Signed)
I am about the same.  He has chronic lower back pain.  He has no new trauma, no weakness, no paresthesias.  He has hand pain, more in the mornings with some swelling.  Spine/Pelvis examination:  Inspection:  Overall, sacoiliac joint benign and hips nontender; without crepitus or defects.   Thoracic spine inspection: Alignment normal without kyphosis present   Lumbar spine inspection:  Alignment  with normal lumbar lordosis, without scoliosis apparent.   Thoracic spine palpation:  without tenderness of spinal processes   Lumbar spine palpation: without tenderness of lumbar area; without tightness of lumbar muscles    Range of Motion:   Lumbar flexion, forward flexion is normal without pain or tenderness    Lumbar extension is full without pain or tenderness   Left lateral bend is normal without pain or tenderness   Right lateral bend is normal without pain or tenderness   Straight leg raising is normal  Strength & tone: normal   Stability overall normal stability  Hands have slight fusiform swelling with pain of the PIP joints.  ROM is good.  NV intact.  Encounter Diagnoses  Name Primary?   Bilateral hand pain Yes   Chronic bilateral low back pain with bilateral sciatica    I will see him in three months.  Continue the Ultram.  Call if any problem.  Precautions discussed.  Electronically Signed Darreld Mclean, MD 11/15/20238:11 AM;

## 2022-07-27 NOTE — Progress Notes (Unsigned)
Received email from covermymeds regarding Tramadol script. Completed PA. Waiting on response.

## 2022-08-22 ENCOUNTER — Telehealth: Payer: Self-pay | Admitting: Orthopaedic Surgery

## 2022-08-22 MED ORDER — TRAMADOL HCL 50 MG PO TABS: ORAL_TABLET | ORAL | 3 refills | Status: DC

## 2022-08-22 NOTE — Telephone Encounter (Signed)
Patient called, requesting a refill on his Tramadol to be sent to [place], [address].  Pt's # [phone]

## 2022-08-22 NOTE — Telephone Encounter (Signed)
Sent to provider 

## 2022-08-23 NOTE — Telephone Encounter (Signed)
Patient came in the office to give [place] his new insurance [employer] and he states that [place] said there is a form that needs to be filled out for the insurance to fill his prescription for Tramadol

## 2022-08-23 NOTE — Telephone Encounter (Signed)
Spoke with pharmacist. Patient's tramadol requires a prior auth. It didn't go through the first time they sent it. They are sending it a different way.

## 2022-09-08 ENCOUNTER — Ambulatory Visit (INDEPENDENT_AMBULATORY_CARE_PROVIDER_SITE_OTHER): Payer: 59

## 2022-09-08 ENCOUNTER — Encounter: Payer: Self-pay | Admitting: Orthopaedic Surgery

## 2022-09-08 ENCOUNTER — Ambulatory Visit: Payer: 59 | Admitting: Orthopaedic Surgery

## 2022-09-08 VITALS — BP 164/100 | Ht 70.0 in | Wt 238.0 lb

## 2022-09-08 DIAGNOSIS — S52514D Nondisplaced fracture of right radial styloid process, subsequent encounter for closed fracture with routine healing: Secondary | ICD-10-CM

## 2022-09-08 DIAGNOSIS — M25531 Pain in right wrist: Secondary | ICD-10-CM

## 2022-09-08 DIAGNOSIS — M5441 Lumbago with sciatica, right side: Secondary | ICD-10-CM

## 2022-09-08 DIAGNOSIS — M19031 Primary osteoarthritis, right wrist: Secondary | ICD-10-CM | POA: Diagnosis not present

## 2022-09-08 DIAGNOSIS — M79641 Pain in right hand: Secondary | ICD-10-CM | POA: Diagnosis not present

## 2022-09-08 DIAGNOSIS — M5442 Lumbago with sciatica, left side: Secondary | ICD-10-CM

## 2022-09-08 DIAGNOSIS — G8929 Other chronic pain: Secondary | ICD-10-CM

## 2022-09-08 DIAGNOSIS — M79642 Pain in left hand: Secondary | ICD-10-CM

## 2022-09-08 NOTE — Progress Notes (Signed)
My hand hurts.  He had an air conditioner compressor fall on his right dominant wrist and forearm about a month ago.  He sustained lacerations that took 14 sutures to repair.  He has had pain in the wrist radially since then.  No X-rays were done.  I will get X-rays today.  He has lower back pain that is a little worse with the cold days and then the unusually warm days that we are having now.  One day is cold and several days later it is in the 60's.  He has no new trauma to the back.  He is taking his medicine.    He has long standing chronic pain of both hands, more in the mornings.  Right wrist is tender radially over the styloid area and there is well healed recent wound of the wrist to the dorsal web area on the right.  ROM is full but tender.  NV intact.  Spine/Pelvis examination:  Inspection:  Overall, sacoiliac joint benign and hips nontender; without crepitus or defects.   Thoracic spine inspection: Alignment normal without kyphosis present   Lumbar spine inspection:  Alignment  with normal lumbar lordosis, without scoliosis apparent.   Thoracic spine palpation:  without tenderness of spinal processes   Lumbar spine palpation: without tenderness of lumbar area; without tightness of lumbar muscles    Range of Motion:   Lumbar flexion, forward flexion is normal without pain or tenderness    Lumbar extension is full without pain or tenderness   Left lateral bend is normal without pain or tenderness   Right lateral bend is normal without pain or tenderness   Straight leg raising is normal  Strength & tone: normal   Stability overall normal stability  X-rays were done of the right wrist, reported separately.  Encounter Diagnoses  Name Primary?   Pain in right wrist Yes   Chronic bilateral low back pain with bilateral sciatica    Bilateral hand pain    I have shown him the X-rays of the wrist and the degenerative changes present.  I also feel he has a healing nondisplaced  radial styloid fracture.  I will give cock-up splint.  His wrist changes may get worse over time.  Return in six weeks.  Continue present medicine.  Call if any problem.  Precautions discussed.  Electronically Signed Sanjuana Kava, MD 2/15/20248:40 AM

## 2022-09-14 ENCOUNTER — Ambulatory Visit (INDEPENDENT_AMBULATORY_CARE_PROVIDER_SITE_OTHER): Payer: 59 | Admitting: Behavioral Health

## 2022-09-14 DIAGNOSIS — F411 Generalized anxiety disorder: Secondary | ICD-10-CM | POA: Diagnosis not present

## 2022-09-14 DIAGNOSIS — F1210 Cannabis abuse, uncomplicated: Secondary | ICD-10-CM | POA: Diagnosis not present

## 2022-09-14 DIAGNOSIS — F32A Depression, unspecified: Secondary | ICD-10-CM

## 2022-09-14 DIAGNOSIS — F1410 Cocaine abuse, uncomplicated: Secondary | ICD-10-CM | POA: Diagnosis not present
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
## 2022-09-17 ENCOUNTER — Encounter: Payer: Self-pay | Admitting: Behavioral Health

## 2022-09-20 ENCOUNTER — Ambulatory Visit (INDEPENDENT_AMBULATORY_CARE_PROVIDER_SITE_OTHER): Payer: 59 | Admitting: Behavioral Health

## 2022-09-20 DIAGNOSIS — F1210 Cannabis abuse, uncomplicated: Secondary | ICD-10-CM

## 2022-09-20 DIAGNOSIS — F411 Generalized anxiety disorder: Secondary | ICD-10-CM | POA: Diagnosis not present

## 2022-09-20 DIAGNOSIS — F32A Depression, unspecified: Secondary | ICD-10-CM

## 2022-09-20 DIAGNOSIS — F1410 Cocaine abuse, uncomplicated: Secondary | ICD-10-CM

## 2022-09-20 NOTE — Progress Notes (Unsigned)
[facility] Counselor/Therapist Progress Note  Patient ID: Shawn Pittman, MRN: [id number],    Date: 09/21/2022  Time Spent: 60 minutes   Treatment Type: Individual Therapy  Reported Symptoms: Depression and anxiety   Mental Status Exam:  Appearance:   Casual     Behavior:  Appropriate and Sharing  Motor:  Normal  Speech/Language:   Clear and Coherent  Affect:  Appropriate and Congruent  Mood:  normal  Thought process:  normal  Thought content:    WNL  Sensory/Perceptual disturbances:    WNL  Orientation:  oriented to person  Attention:  Good  Concentration:  Good  Memory:  WNL  Fund of knowledge:   Good  Insight:    Good  Judgment:   Good  Impulse Control:  Good   Risk Assessment: Danger to Self:  No Self-injurious Behavior: No Danger to Others: No Duty to Warn:no Physical Aggression / Violence:No  Access to Firearms a concern: No  Gang Involvement:No   Subjective:   The patient denied abuse of Cocaine he reports to smoke Marijuana daily. He states it helps "With my joint pain a little bit and it helps calm me down". The patient reports since last being seen "Me and my wife aint got no closer, still feel distant with her". The patient expressed concerns regarding trust and states he is contemplating divorce. He states his wife is aware of his feelings. The patient reports he has tried couples therapy in the past and states it was not helpful. He reports no current interest with attending couples counseling. He reports concerns of accusations he wife makes regarding infidelity. The patient states his coping mechanism is Cocaine. He reports anger steams from his childhood. The patient identified anger warning signs as sweating, clinch fist, get red in the face. The patient reports being kicked out of his house at the age of 71.   The patient rated depression at a 6 and anxiety as "That's hard to describe because it comes and goes its probably a 6 or 7" on a scale of  1 to 10 with 10 being severe. The patient reports he is medication compliant. The patient reflected on scenarios where he give back to the community. The patient identified triggers for illicit Cocaine abuse as his spouse. He states his longest period of sobriety as 10 years. He rated his motivation to change as "I am ready". He identified pros of changing as "It saves me a whole lot of money". He states cons as "I don't really think there is a con about changing". The patient identified his wife as a support system. He states plans to "Stay in church, stay away from the dope man the rest should fall in place". The patient identified strength as "If I set my mind to doing something its a done deal". The patient identified his take away from today's session as "I wasn't for sure if I was ready and now I know I am". The patient denied SI/HI, AH/VH.  Counselor conducted check in. Counselor discussed mental health symptoms and requested the patient rate the symptoms. Counselor discussed medication compliance. Counselor questioned substance abuse. Counselor questioned coping strategies utilized. Counselor discussed risk associated with intravenous abuse. Counselor discussed pros and cons of change. Counselor questioned the patients motivation to change. Counselor educated the patient on anger warning signs and requested feedback from the patient regarding warning signs he identified with. Counselor utilized reflective listening and validated the patients concerns. Counselor assessed for SI/HI, AH/VH.  Interventions: Motivational Interviewing  Diagnosis:   ICD-10-CM   1. Depression, unspecified depression type  F32.A     2. Anxiety state  F41.1     3. Cocaine abuse ([place])  F14.10     4. Cannabis abuse  F12.10       Plan:    1) Long Term Goal: Develop strategies to reduce symptoms.     Short Term Goal: Reduce anxiety and improve coping skills.      Objective: Manage panic episodes.      Objective: Learn two new ways of coping with routine stressors.    2) Long Term Goal: Alleviate depressive symptoms and return to previous level of effective functioning.     Short Term Goal: Improve mood overall.     Objective: Develop strategies for thought distraction when ruminating on the past.     Objective: Get through a day/week without a crying spell.     3) Long Term Goal: Learn and implement anger management skills to reduce the level of anger and irritability that accompanies it.      Short Term Goal: Increase and practice ability to manage anger.      Objective: Learn two positive anger management skills     Objective: Learn and practice anger management skills espically in situations where people are not treating him respectfully.     Objective: Walk away from situations that trigger strong emotions.     Objective: Get through an entire day without angry mood swings.    4) Long Term Goal: Withdraw from mood-altering substances, stabilize physically and emotionally, and then establish a supportive recovery plan.      Short Term Goal: Be free of drug abuse.     Objective: Avoid people,places, and situations where temptation might be overwhelming.     Objective: Learn triggers for substance abuse.       Jannifer Hick, [facility]

## 2022-09-21 ENCOUNTER — Encounter: Payer: Self-pay | Admitting: Behavioral Health

## 2022-10-05 ENCOUNTER — Ambulatory Visit (INDEPENDENT_AMBULATORY_CARE_PROVIDER_SITE_OTHER): Payer: 59 | Admitting: Behavioral Health

## 2022-10-05 DIAGNOSIS — F411 Generalized anxiety disorder: Secondary | ICD-10-CM | POA: Diagnosis not present

## 2022-10-05 DIAGNOSIS — F1210 Cannabis abuse, uncomplicated: Secondary | ICD-10-CM | POA: Diagnosis not present

## 2022-10-05 DIAGNOSIS — F32A Depression, unspecified: Secondary | ICD-10-CM

## 2022-10-05 DIAGNOSIS — F1410 Cocaine abuse, uncomplicated: Secondary | ICD-10-CM
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
## 2022-10-06 ENCOUNTER — Encounter: Payer: Self-pay | Admitting: Behavioral Health

## 2022-10-19 ENCOUNTER — Ambulatory Visit: Payer: 59 | Admitting: Orthopaedic Surgery

## 2022-10-19 ENCOUNTER — Ambulatory Visit (INDEPENDENT_AMBULATORY_CARE_PROVIDER_SITE_OTHER): Payer: 59 | Admitting: Behavioral Health

## 2022-10-19 ENCOUNTER — Encounter: Payer: Self-pay | Admitting: Orthopaedic Surgery

## 2022-10-19 VITALS — BP 152/94 | HR 72 | Ht 70.0 in | Wt 231.0 lb

## 2022-10-19 DIAGNOSIS — F411 Generalized anxiety disorder: Secondary | ICD-10-CM

## 2022-10-19 DIAGNOSIS — M79642 Pain in left hand: Secondary | ICD-10-CM

## 2022-10-19 DIAGNOSIS — M25531 Pain in right wrist: Secondary | ICD-10-CM

## 2022-10-19 DIAGNOSIS — F32A Depression, unspecified: Secondary | ICD-10-CM | POA: Diagnosis not present

## 2022-10-19 DIAGNOSIS — M79641 Pain in right hand: Secondary | ICD-10-CM | POA: Diagnosis not present

## 2022-10-19 DIAGNOSIS — M2550 Pain in unspecified joint: Secondary | ICD-10-CM

## 2022-10-19 DIAGNOSIS — F1210 Cannabis abuse, uncomplicated: Secondary | ICD-10-CM | POA: Diagnosis not present

## 2022-10-19 MED ORDER — OXYCODONE-ACETAMINOPHEN 5-325 MG PO TABS: ORAL_TABLET | ORAL | 0 refills | Status: DC

## 2022-10-19 NOTE — Progress Notes (Signed)
My hands and wrist are worse.  He has more pain in the hands and wrist, more on the right.  He has worn out the cock-up splint.  He is working.  He has pain more in the mornings but also through out the day.  He has no new trauma.  He has swelling and redness at times.  He is on Relafen.  The Ultram is not helping.  He has had problems with medicine in the past but needs something now to help him.  I will give Percocet 5 for a brief time.  I had a good talk with him about this.  He has been on hydrocodone and it did not help that much.  Hands have fusiform swelling of the fingers, more on the right and the right wrist has dorsal and radial swelling.  He has no redness.  NV intact.  Motion is decreased.  Encounter Diagnoses  Name Primary?   Pain in right wrist Yes   Bilateral hand pain    Pain in joint involving multiple sites    I have reviewed the [place] web site prior to prescribing narcotic medicine for this patient.  Return in six weeks.  I will get labs, rheumatoid screen.  Call if any problem.  Precautions discussed.  Electronically Signed Sanjuana Kava, MD 3/27/20248:43 AM
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
## 2022-10-19 NOTE — Patient Instructions (Addendum)
Follow up: 6 weeks  Get your labs drawn at [facility]: [place] 2nd floor  [place] NARCOTIC/PAIN MEDICATION. DO NOT DRIVE OR OPERATE HEAVY MACHINERY WHILE USING THIS MEDICATION. PAIN MEDICATION ALSO CAUSES CONSTIPATION. YOU CAN USE A STOOL SOFTENER OR MIRALAX TO HELP WITH THIS.   This is an increase from what you were previously on.   DR.KEELING'S SCHEDULE IS AS FOLLOWS: TUESDAY: ALL DAY WEDNESDAY: MORNING ONLY THURSDAY: MORNING ONLY PLEASE CALL OR SEND A MESSAGE VIA MYCHART BY WEDNESDAY MORNING SO THAT I CAN SEND A REQUEST TO HIM. HE LEAVES THURSDAY BY 11:30AM. HE DOES NOT RETURN TO THE OFFICE UNTIL TUESDAY MORNINGS AND HE DOES NOT CHECK HIS WORK MESSAGES DURING HIS TIME AWAY FROM THE OFFICE.

## 2022-10-20 ENCOUNTER — Ambulatory Visit: Payer: 59 | Admitting: Orthopaedic Surgery

## 2022-10-22 LAB — CBC WITH DIFFERENTIAL/PLATELET
Absolute Monocytes: 614 cells/uL (ref 200–950)
Basophils Absolute: 41 cells/uL (ref 0–200)
Basophils Relative: 0.7 %
Eosinophils Absolute: 89 cells/uL (ref 15–500)
Eosinophils Relative: 1.5 %
HCT: 43 % (ref 38.5–50.0)
Hemoglobin: 15 g/dL (ref 13.2–17.1)
Lymphs Abs: 1516 cells/uL (ref 850–3900)
MCH: 29.9 pg (ref 27.0–33.0)
MCHC: 34.9 g/dL (ref 32.0–36.0)
MCV: 85.8 fL (ref 80.0–100.0)
MPV: 11.3 fL (ref 7.5–12.5)
Monocytes Relative: 10.4 %
Neutro Abs: 3640 cells/uL (ref 1500–7800)
Neutrophils Relative %: 61.7 %
Platelets: 206 10*3/uL (ref 140–400)
RBC: 5.01 10*6/uL (ref 4.20–5.80)
RDW: 13.4 % (ref 11.0–15.0)
Total Lymphocyte: 25.7 %
WBC: 5.9 10*3/uL (ref 3.8–10.8)

## 2022-10-22 LAB — SEDIMENTATION RATE: Sed Rate: 6 mm/h (ref 0–20)

## 2022-10-22 LAB — ANTI-SMITH ANTIBODY: ENA SM Ab Ser-aCnc: <1 AI

## 2022-10-22 LAB — ANTI-NUCLEAR AB-TITER (ANA TITER): ANA Titer 1: 1:80 {titer} — ABNORMAL HIGH

## 2022-10-22 LAB — CHROMATIN (NUCLEOSOMAL) ANTIBODY: Chromatin (Nucleosomal) Antibody: <1 AI

## 2022-10-22 LAB — ANA SCREEN,IFA, WITH REFLEX TO TITER AND PATTERN/LUPUS PANEL1: Anti Nuclear Antibody (ANA): POSITIVE — AB

## 2022-10-22 LAB — RHEUMATOID FACTOR: Rheumatoid fact SerPl-aCnc: <14 IU/mL (ref <14)

## 2022-10-22 LAB — ANTI-DNA ANTIBODY, DOUBLE-STRANDED: ds DNA Ab: 6 IU/mL — ABNORMAL HIGH

## 2022-10-22 LAB — INTERPRETATION

## 2022-10-23 ENCOUNTER — Encounter: Payer: Self-pay | Admitting: Behavioral Health

## 2022-10-24 ENCOUNTER — Other Ambulatory Visit: Payer: Self-pay

## 2022-10-24 NOTE — Telephone Encounter (Signed)
Oxycodone-Acetaminophen 5/325 MG Qty 25 Tablets  One tablet every four hours as need for pain, 5 day limit.   PATIENT USES Shawn Pittman

## 2022-10-25 ENCOUNTER — Telehealth: Payer: Self-pay

## 2022-10-25 NOTE — Telephone Encounter (Signed)
Could you check to see why Dr. Luna Glasgow hasn't done this patient's refill. I see where you sent it to him on Monday, but it hasn't been done yet.

## 2022-10-26 MED ORDER — OXYCODONE-ACETAMINOPHEN 5-325 MG PO TABS: ORAL_TABLET | ORAL | 0 refills | Status: DC

## 2022-10-31 ENCOUNTER — Telehealth: Payer: Self-pay | Admitting: Orthopaedic Surgery

## 2022-10-31 NOTE — Telephone Encounter (Signed)
Dr. Sanjuan Dame pt - spoke w/the patient, he's requesting a refill on Oxycodone 5-325 to be sent to [facility] in [place].

## 2022-11-02 MED ORDER — OXYCODONE-ACETAMINOPHEN 5-325 MG PO TABS: ORAL_TABLET | ORAL | 0 refills | Status: DC

## 2022-11-08 ENCOUNTER — Ambulatory Visit: Payer: 59 | Admitting: Orthopaedic Surgery

## 2022-11-08 ENCOUNTER — Telehealth: Payer: Self-pay

## 2022-11-08 ENCOUNTER — Encounter: Payer: Self-pay | Admitting: Orthopaedic Surgery

## 2022-11-08 VITALS — BP 174/103 | HR 65

## 2022-11-08 DIAGNOSIS — M25531 Pain in right wrist: Secondary | ICD-10-CM

## 2022-11-08 DIAGNOSIS — M5442 Lumbago with sciatica, left side: Secondary | ICD-10-CM

## 2022-11-08 DIAGNOSIS — M2550 Pain in unspecified joint: Secondary | ICD-10-CM

## 2022-11-08 DIAGNOSIS — G8929 Other chronic pain: Secondary | ICD-10-CM

## 2022-11-08 DIAGNOSIS — M79642 Pain in left hand: Secondary | ICD-10-CM

## 2022-11-08 DIAGNOSIS — G894 Chronic pain syndrome: Secondary | ICD-10-CM | POA: Diagnosis not present

## 2022-11-08 DIAGNOSIS — M5441 Lumbago with sciatica, right side: Secondary | ICD-10-CM

## 2022-11-08 DIAGNOSIS — M79641 Pain in right hand: Secondary | ICD-10-CM | POA: Diagnosis not present

## 2022-11-08 NOTE — Progress Notes (Signed)
My hands are not any better.  He has questions about his pain medicine refills.  I told him I will do it weekly, not every five days as the pharmacy told him.  He is on Percocet and I must follow the guidelines.  He understands.  He had abnormal lab results, his ANA is greater than 1:80 and has homogenous pattern, his Anti-DNA antibody is increased to over 6.  I will have him see rheumatologist for this.  He has bilateral hand pain, some slight fusiform swelling of the fingers today but less than before.  NV intact.  Encounter Diagnoses  Name Primary?   Pain in right wrist Yes   Bilateral hand pain    Chronic bilateral low back pain with bilateral sciatica    Chronic pain syndrome    Pain in joint involving multiple sites    I have reviewed the [place] Controlled Substance Reporting System web site prior to prescribing narcotic medicine for this patient.  Return in one month.  I will refill medicine tomorrow.  Call if any problem.  Precautions discussed.  Electronically Signed Darreld Mclean, MD 4/16/202410:23 AM

## 2022-11-08 NOTE — Patient Instructions (Addendum)
You've been referred to:  Pain Management Address: [address], [place], [place] [address] Phone: [phone] Odette Fraction, MD  [facility] [address]  [phone] Janalyn Rouse B. Deveshwar, MD   If you haven't heard from them by the end of the week, please call them to schedule your first appointment  DR.KEELING'S SCHEDULE IS AS FOLLOWS: TUESDAY: ALL DAY WEDNESDAY: MORNING ONLY THURSDAY: MORNING ONLY PLEASE CALL OR SEND A MESSAGE VIA MYCHART BY WEDNESDAY MORNING SO THAT I CAN SEND A REQUEST TO HIM. HE LEAVES THURSDAY BY 11:30AM. HE DOES NOT RETURN TO THE OFFICE UNTIL TUESDAY MORNINGS AND HE DOES NOT CHECK HIS WORK MESSAGES DURING HIS TIME AWAY FROM THE OFFICE.

## 2022-11-09 MED ORDER — OXYCODONE-ACETAMINOPHEN 5-325 MG PO TABS: ORAL_TABLET | ORAL | 0 refills | Status: DC

## 2022-11-09 NOTE — Addendum Note (Signed)
Addended by: Earnstine Regal on: 11/09/2022 08:31 AM   Modules accepted: Orders

## 2022-11-10 ENCOUNTER — Ambulatory Visit: Payer: 59 | Admitting: Mental Health

## 2022-11-15 ENCOUNTER — Telehealth: Payer: Self-pay | Admitting: Orthopaedic Surgery

## 2022-11-15 NOTE — Telephone Encounter (Signed)
Dr. Sanjuan Dame pt - pt lvm requesting a refill on Oxycodone 5-325 to be sent to [facility] in [place]

## 2022-11-16 MED ORDER — OXYCODONE-ACETAMINOPHEN 5-325 MG PO TABS: ORAL_TABLET | ORAL | 0 refills | Status: DC

## 2022-11-17 ENCOUNTER — Telehealth: Payer: Self-pay | Admitting: Orthopaedic Surgery

## 2022-11-17 NOTE — Telephone Encounter (Signed)
Dr. Sanjuan Dame pt - pt lvm stating that he got a call from the pain clinic in [place] and that they told him that all they offer is PT and injections and those don't help him.  He stated that they aren't taking new patient's and don't give medications.  He stated that something else will need to be done.  He would like a call back [phone]

## 2022-11-21 ENCOUNTER — Other Ambulatory Visit: Payer: Self-pay | Admitting: Orthopaedic Surgery

## 2022-11-21 MED ORDER — OXYCODONE-ACETAMINOPHEN 5-325 MG PO TABS: ORAL_TABLET | ORAL | 0 refills | Status: DC

## 2022-11-21 NOTE — Telephone Encounter (Signed)
Spoke with patient. States he has had back problems since 1991 and has had injections and PT and that doesn't work. Will send referral to [facility] in [place]/ Dr.Ramos

## 2022-11-24 ENCOUNTER — Other Ambulatory Visit: Payer: Self-pay | Admitting: Orthopaedic Surgery

## 2022-11-28 ENCOUNTER — Other Ambulatory Visit (HOSPITAL_COMMUNITY): Payer: Self-pay | Admitting: Student

## 2022-11-28 ENCOUNTER — Telehealth: Payer: Self-pay | Admitting: Orthopaedic Surgery

## 2022-11-28 ENCOUNTER — Ambulatory Visit (HOSPITAL_COMMUNITY)
Admission: RE | Admit: 2022-11-28 | Discharge: 2022-11-28 | Disposition: A | Discharge status: Home / Self Care | Payer: 59 | Source: Ambulatory Visit | Attending: Student | Admitting: Student

## 2022-11-28 DIAGNOSIS — R0781 Pleurodynia: Secondary | ICD-10-CM | POA: Diagnosis not present

## 2022-11-28 NOTE — Telephone Encounter (Signed)
Dr. Sanjuan Pittman pt - pt lvm requesting a refill on Oxycodone 5-325 to be sent to [facility]

## 2022-11-30 ENCOUNTER — Ambulatory Visit: Payer: 59 | Admitting: Orthopaedic Surgery

## 2022-11-30 MED ORDER — OXYCODONE-ACETAMINOPHEN 5-325 MG PO TABS: ORAL_TABLET | ORAL | 0 refills | Status: DC

## 2022-12-06 ENCOUNTER — Ambulatory Visit: Payer: 59 | Admitting: Orthopaedic Surgery

## 2022-12-06 ENCOUNTER — Telehealth: Payer: Self-pay | Admitting: Orthopaedic Surgery

## 2022-12-06 ENCOUNTER — Encounter: Payer: Self-pay | Admitting: Physician Assistant

## 2022-12-06 ENCOUNTER — Encounter: Payer: Self-pay | Admitting: Orthopaedic Surgery

## 2022-12-06 ENCOUNTER — Ambulatory Visit (INDEPENDENT_AMBULATORY_CARE_PROVIDER_SITE_OTHER): Payer: Self-pay | Admitting: Physician Assistant

## 2022-12-06 VITALS — BP 170/93 | HR 62 | Ht 70.0 in | Wt 231.0 lb

## 2022-12-06 VITALS — BP 150/86 | HR 77 | Ht 70.0 in | Wt 231.5 lb

## 2022-12-06 DIAGNOSIS — M5441 Lumbago with sciatica, right side: Secondary | ICD-10-CM

## 2022-12-06 DIAGNOSIS — G8929 Other chronic pain: Secondary | ICD-10-CM

## 2022-12-06 DIAGNOSIS — M79642 Pain in left hand: Secondary | ICD-10-CM

## 2022-12-06 DIAGNOSIS — G894 Chronic pain syndrome: Secondary | ICD-10-CM | POA: Diagnosis not present

## 2022-12-06 DIAGNOSIS — M79641 Pain in right hand: Secondary | ICD-10-CM

## 2022-12-06 DIAGNOSIS — F411 Generalized anxiety disorder: Secondary | ICD-10-CM

## 2022-12-06 DIAGNOSIS — M5442 Lumbago with sciatica, left side: Secondary | ICD-10-CM | POA: Diagnosis not present

## 2022-12-06 DIAGNOSIS — F32A Depression, unspecified: Secondary | ICD-10-CM

## 2022-12-06 DIAGNOSIS — F1210 Cannabis abuse, uncomplicated: Secondary | ICD-10-CM

## 2022-12-06 DIAGNOSIS — R454 Irritability and anger: Secondary | ICD-10-CM

## 2022-12-06 DIAGNOSIS — F1410 Cocaine abuse, uncomplicated: Secondary | ICD-10-CM

## 2022-12-06 NOTE — Progress Notes (Signed)
I had rib pain  He had a coughing episode last week.  He went to [facility].  A physician there gave him oxycodone 10 for pain.  I had been giving oxycodone 5.  He has chronic pain syndrome.  He wants 10 mgm now.  I told him no.  I call in oxycodone 5 every Wednesday.  He did not get the medicine from me last week as he had the other narcotic.    I have tried to get him into a pain clinic.  Dr. Ethelene Hal does not take his insurance.  [facility] does not take his insurance.  I will have the office try to get him into [facility] pain clinic.  He wants an early refill today on his pain medicine.  I told him no.  It is every Wednesday.  He wants more quantity.  I told him no.    He has chronic pain of his hands and back.  Exam is negative today.  Encounter Diagnoses  Name Primary?   Chronic bilateral low back pain with bilateral sciatica Yes   Chronic pain syndrome    Bilateral hand pain    Return in one month.  Try to get referral to [facility] pain clinic.  Call if any problem.  Precautions discussed.  Electronically Signed Darreld Mclean, MD 5/14/20248:45 AM

## 2022-12-06 NOTE — Telephone Encounter (Signed)
Dr  Sherril Croon will meet with him and discuss taking over pain management until he is able to establish with pain management, they have reached out to him to schedule but he has not responded to phone calls   I will call him too to let him know

## 2022-12-06 NOTE — Telephone Encounter (Signed)
Left message for him to call Dr Sherril Croon office to schedule appointment to discuss

## 2022-12-06 NOTE — Telephone Encounter (Signed)
Dr Hilda Lias had me leave a message to see if Dr Sherril Croon or his PA can take over the patients pain medication until he gets established with a pain clinic. They will call back

## 2022-12-06 NOTE — Telephone Encounter (Signed)
Dr. Sanjuan Dame pt Shawn Pittman w/[facility] [phone] lvm for Amy at 11:05am stating she needs to know if the patient has been referred, what pain medication he's own and when is the last time he got it.  She will present to Dr. Sherril Croon and see if he will agree to see him.

## 2022-12-06 NOTE — Patient Instructions (Signed)
The Phone number for pain clinic in [place] is : [phone]

## 2022-12-06 NOTE — Telephone Encounter (Signed)
I have already discussed with her, see other messages
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
## 2022-12-07 MED ORDER — OXYCODONE-ACETAMINOPHEN 5-325 MG PO TABS: ORAL_TABLET | ORAL | 0 refills | Status: DC

## 2022-12-07 NOTE — Addendum Note (Signed)
Addended by: Earnstine Regal on: 12/07/2022 10:18 AM   Modules accepted: Orders

## 2022-12-12 ENCOUNTER — Other Ambulatory Visit: Payer: Self-pay | Admitting: Orthopaedic Surgery

## 2022-12-12 NOTE — Telephone Encounter (Signed)
Patient called, lvm requesting a refill on his Hydrocodone, he stated it's not due until Wednesday.  I called the pt back a lvm that he'll need to call Wednesday morning to request a refill since it's not due until Wednesday.

## 2022-12-13 NOTE — Telephone Encounter (Signed)
I called patient and advised the below per Dr Hilda Lias.

## 2022-12-13 NOTE — Telephone Encounter (Signed)
KEELING Patient called at 8:24 am left voicemail stating he needs a refill on his pain medicine.  He did not say which one, just he needs his pain medicine refilled.  He called yesterday and Hope told him it was to soon to fill at this time.   He would like a call back from Dr. Hilda Lias nurse, that we got his message and is it getting filled.

## 2022-12-13 NOTE — Telephone Encounter (Signed)
Patient also advise his referral pending at [facility] for Pain Management.    Patient questions why he is given 5 day supply every 8 days.  I am unsure, Dr Hilda Lias is provider, not me, advised him this.

## 2022-12-14 MED ORDER — OXYCODONE-ACETAMINOPHEN 5-325 MG PO TABS: ORAL_TABLET | ORAL | 0 refills | Status: DC

## 2022-12-14 NOTE — Telephone Encounter (Signed)
[facility], Dr Ethelene Hal does not do medication management only injections so he was to go to [place], hopefully that is pending

## 2022-12-20 ENCOUNTER — Telehealth: Payer: Self-pay | Admitting: Orthopaedic Surgery

## 2022-12-20 DIAGNOSIS — G8929 Other chronic pain: Secondary | ICD-10-CM

## 2022-12-20 NOTE — Telephone Encounter (Signed)
Dr. Sanjuan Dame pt - pt lvm stating that Dr. Hilda Lias has been trying to get him in with a pain management clinic that accepts his insurance.  He wants to be referred to [facility] [phone]

## 2022-12-21 ENCOUNTER — Telehealth: Payer: Self-pay | Admitting: Orthopaedic Surgery

## 2022-12-21 NOTE — Addendum Note (Signed)
Addended by: Michaele Offer on: 12/21/2022 10:15 AM   Modules accepted: Orders

## 2022-12-21 NOTE — Telephone Encounter (Signed)
Dr. Keeling's pt - pt lvm requesting a refill on Oxycodone 5-325 to be sent to [facility] [place] 

## 2022-12-22 ENCOUNTER — Telehealth: Payer: Self-pay | Admitting: Orthopaedic Surgery

## 2022-12-22 MED ORDER — OXYCODONE-ACETAMINOPHEN 5-325 MG PO TABS: ORAL_TABLET | ORAL | 0 refills | Status: DC

## 2022-12-22 NOTE — Telephone Encounter (Signed)
Referral to Dr Riley Kill entered in epic, I called patient to advise, "call cannot be completed at this time".

## 2022-12-22 NOTE — Addendum Note (Signed)
Addended by: Earnstine Regal on: 12/22/2022 11:42 AM   Modules accepted: Orders

## 2022-12-22 NOTE — Telephone Encounter (Signed)
The patient called this morning and lvm stating that he had talked to a staff member earlier this week and was told to not call until Wednesday to request a refill.  He stated he did and he doesn't know what is going on and would like a call back.

## 2022-12-27 ENCOUNTER — Telehealth: Payer: Self-pay | Admitting: Orthopaedic Surgery

## 2022-12-27 ENCOUNTER — Encounter: Payer: Self-pay | Admitting: Physical Medicine & Rehabilitation

## 2022-12-27 NOTE — Telephone Encounter (Signed)
Dr. Keeling's pt - pt lvm requesting a refill on Oxycodone 5-325 to be sent to [facility] in [place] 

## 2022-12-28 ENCOUNTER — Encounter: Payer: 59 | Attending: Physical Medicine & Rehabilitation | Admitting: Physical Medicine & Rehabilitation

## 2022-12-28 ENCOUNTER — Encounter: Payer: Self-pay | Admitting: Physical Medicine & Rehabilitation

## 2022-12-28 VITALS — BP 152/103 | HR 69 | Resp 16 | Ht 70.0 in | Wt 229.8 lb

## 2022-12-28 DIAGNOSIS — Z5181 Encounter for therapeutic drug level monitoring: Secondary | ICD-10-CM

## 2022-12-28 DIAGNOSIS — M47816 Spondylosis without myelopathy or radiculopathy, lumbar region: Secondary | ICD-10-CM | POA: Diagnosis present

## 2022-12-28 DIAGNOSIS — Z79891 Long term (current) use of opiate analgesic: Secondary | ICD-10-CM | POA: Diagnosis present

## 2022-12-28 DIAGNOSIS — M130 Polyarthritis, unspecified: Secondary | ICD-10-CM | POA: Diagnosis present

## 2022-12-28 DIAGNOSIS — F341 Dysthymic disorder: Secondary | ICD-10-CM

## 2022-12-28 DIAGNOSIS — G894 Chronic pain syndrome: Secondary | ICD-10-CM | POA: Diagnosis present

## 2022-12-28 MED ORDER — DICLOFENAC SODIUM 75 MG PO TBEC: 75.0000 mg | DELAYED_RELEASE_TABLET | Freq: Two times a day (BID) | ORAL | 3 refills | Status: DC

## 2022-12-28 MED ORDER — OXYCODONE-ACETAMINOPHEN 5-325 MG PO TABS: ORAL_TABLET | ORAL | 0 refills | Status: DC

## 2022-12-28 NOTE — Progress Notes (Signed)
Subjective:    Patient ID: Shawn Pittman, male    DOB: May 22, 1966, 57 y.o.   MRN: [id number]  HPI This is initial visit for Shawn Pittman who is here for assessment of chronic pain syndrome. He was injured in a 75' fall back in 1991 while on the job. He has had consistent low back problems since the injury. He has had back injections, seen a chiropractor. He's never had surgery on his back.    Pain awakens him at night with stabbing pain.   He recently broke his right radial bone when a copper wire tore his skin and grabbed the bone. He's also broken numerous other ribs and bones while on the job.   His PCP has been writing from percocet  recently since March. He writes for 25 per week. He had been on tramadol previously for a few years which had been working for him at a 3 per day. He is relafen for the last 2-3 months in place of naproxen which was helpful initially. More recently it hasn't helped.   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 He works full time.    His most recent MRI is from 2019: T10-11, T11-12 and T12-L1 are imaged in the sagittal plane only and negative.   L1-2: Mild loss of disc space height and a shallow bulge without stenosis.   L2-3: Small protrusion in the far periphery of the right foramen is unchanged. No central canal or foraminal stenosis.   L3-4: Shallow disc bulge and a superimposed left paracentral protrusion with slight caudal extension are identified. There is mild central canal and bilateral foraminal narrowing. Mild narrowing is also present in the left lateral recess. Degenerative disease has mildly progressed since the prior exam.   L4-5: Mild to moderate facet degenerative disease. There is a broad-based central and slightly eccentric to the left disc protrusion with some caudal extension. The disc causes  moderate central canal stenosis and there is narrowing of both subarticular recesses with encroachment on the descending L5 roots. The right foramen is open. There is mild left foraminal narrowing. The appearance is unchanged.   L5-S1: Shallow disc bulge and mild facet degenerative disease. The central canal is widely patent. Mild to moderate bilateral foraminal narrowing appears worse than on the prior MRI.   He had a positive  ANA test in March and his primary referred him to rheumatology for assessment.   Pain Inventory Average Pain 8 Pain Right Now 8 My pain is constant, dull, stabbing, tingling, and aching  In the last 24 hours, has pain interfered with the following? General activity 7 Relation with others 7 Enjoyment of life 7 What TIME of day is your pain at its worst? morning , daytime, evening, and night Sleep (in general) NA  Pain is worse with: walking, bending, sitting, standing, and some activites Pain improves with: medication Relief from Meds: 8  walk without assistance ability to climb steps?  yes do you drive?  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  no  Any changes since last visit?  no Orthopedist [place]    Family History  Problem Relation Age of Onset   Heart attack Mother    Cancer Father    Social History  Socioeconomic History   Marital status: Married    Spouse name: Not on file   Number of children: Not on file   Years of education: Not on file   Highest education level: Not on file  Occupational History   Not on file  Tobacco Use   Smoking status: Never   Smokeless tobacco: Never  Vaping Use   Vaping Use: Never used  Substance and Sexual Activity   Alcohol use: Not Currently    Comment: no ETOH since 2000   Drug use: Not Currently    Types: Marijuana, Cocaine    Comment: none since 08/2022   Sexual  activity: Yes    Partners: Female    Comment: wife had implant  Other Topics Concern   Not on file  Social History Narrative   Not on file   Social Determinants of Health   Financial Resource Strain: Not on file  Food Insecurity: Not on file  Transportation Needs: Not on file  Physical Activity: Not on file  Stress: Not on file  Social Connections: Not on file   Past Surgical History:  Procedure Laterality Date   EYE SURGERY  1989   removed R eye   Past Medical History:  Diagnosis Date   Back pain, chronic    Hyperlipidemia    Hypertension    MRSA (methicillin resistant Staphylococcus aureus)    BP (!) 152/103   Pulse 69   Resp 16   Ht 5\' 10"  (1.778 m)   Wt 229 lb 12.8 oz (104.2 kg)   BMI 32.97 kg/m   Opioid Risk Score:   Fall Risk Score:  `1  Depression screen [facility] 2/9     12/28/2022    1:59 PM  Depression screen PHQ 2/9  Decreased Interest 3  Down, Depressed, Hopeless 2  PHQ - 2 Score 5  Altered sleeping 3  Tired, decreased energy 3  Change in appetite 1  Feeling bad or failure about yourself  2  Trouble concentrating 1  Moving slowly or fidgety/restless 0  Suicidal thoughts 0  PHQ-9 Score 15  Difficult doing work/chores Somewhat difficult     Review of Systems  Constitutional:  Positive for diaphoresis and unexpected weight change.       Wt gain  HENT: Negative.    Eyes: Negative.   Respiratory:  Positive for cough.   Cardiovascular: Negative.   Gastrointestinal:  Positive for abdominal pain and constipation.  Endocrine: Negative.   Genitourinary: Negative.   Musculoskeletal:  Positive for arthralgias, back pain, myalgias and neck pain.       Spasms  Skin:  Positive for rash.  Allergic/Immunologic: Negative.   Hematological: Negative.   Psychiatric/Behavioral:  Positive for confusion and dysphoric mood. The patient is nervous/anxious.   All other systems reviewed and are negative.      Objective:   Physical Exam  General: Alert and  oriented x 3, No apparent distress HEENT: Head is normocephalic, atraumatic, PERRLA, EOMI, sclera anicteric, oral mucosa pink and moist, dentition intact, ext ear canals clear,  Neck: Supple without JVD or lymphadenopathy Heart: Reg rate and rhythm. No murmurs rubs or gallops Chest: CTA bilaterally without wheezes, rales, or rhonchi; no distress Abdomen: Soft, non-tender, non-distended, bowel sounds positive. Extremities: No clubbing, cyanosis, or edema. Pulses are 2+ Psych: Pt's affect is appropriate. Pt is cooperative Skin: Clean and intact without signs of breakdown.  Scarring of right wrist due to trauma.  Both hands are callused and dirty from work. Neuro:  Alert and oriented x 3. Normal insight and awareness. Intact Memory. Normal language and speech. Cranial nerve exam unremarkable. MMT: 5/5 UE and LE's. Sensory exam normal for light touch and pain in all 4 limbs. No limb ataxia or cerebellar signs. No abnormal tone appreciated.  Normal gait. Musculoskeletal: low back tender to palpation.  Patient actually has good range of motion and was able to touch his toes and extend without pain.  He did have some limitations with rotation to the right but that was generally due to his tender costal cartilage.  Lateral bending was also limited somewhat to the righ  Hamstrings were fairly flexible.  Straight leg testing was negative.  Has chronic arthritic changes in both hands.       Assessment & Plan:  Chronic pain syndrome related to multiple traumas. Pt with multiple arthridities including hands and wrists.  He has worked a heavy duty job for some time and shows the results of his labor on exam. 2. Lumbar spondylosis with degenerative disc disease especially at L3-4, L4-5 3. Recent + ANA    Plan: I would be willing to resume tramadol at 50 mg 3 times daily.  Would stop Percocet. Begin trial of diclofenac at 75 mg bid to replace Relafen UDS.  If slightly positive for marijuana, would be  willing still to write for tramadol initially.  If any further test come back positive for marijuana all controlled substances would be stopped. Discussed appropriate activity modification as possible.  He really just cannot keep up doing the work he is doing at his age. Rheumatology appointment pending for later this summer Spent about 45 minutes with the patient and reviewing his chart and discussing his case, coming up with a custom treatment plan and performing examination.  I will see him back in about 6 weeks.

## 2022-12-28 NOTE — Patient Instructions (Signed)
IF YOUR URINE SCREEN ONLY HAS A SMALL AMOUNT OF MARIJUANA I WOULD BE WILLING TO WRITE FOR TRAMADOL  STOP RELAFEN, BEGIN DICLOFENAC 75MG  TWICE DAILY

## 2022-12-31 LAB — TOXASSURE SELECT,+ANTIDEPR,UR

## 2023-01-03 ENCOUNTER — Encounter: Payer: Self-pay | Admitting: Orthopaedic Surgery

## 2023-01-03 ENCOUNTER — Ambulatory Visit: Payer: 59 | Admitting: Orthopaedic Surgery

## 2023-01-03 VITALS — BP 184/99 | HR 72 | Ht 70.0 in | Wt 237.0 lb

## 2023-01-03 DIAGNOSIS — M5441 Lumbago with sciatica, right side: Secondary | ICD-10-CM

## 2023-01-03 DIAGNOSIS — G894 Chronic pain syndrome: Secondary | ICD-10-CM | POA: Diagnosis not present

## 2023-01-03 DIAGNOSIS — M5442 Lumbago with sciatica, left side: Secondary | ICD-10-CM

## 2023-01-03 DIAGNOSIS — G8929 Other chronic pain: Secondary | ICD-10-CM

## 2023-01-03 NOTE — Progress Notes (Signed)
I am the same.  I hurt.  He went to the pain clinic we sent him too.  He did not like the doctor there and will not go back.  He has not gone back to [facility] yet to discuss his pain medicine.  His appointment is later.  He has appointment to see rheumatologist in August.  He has chronic back pain.  Spine/Pelvis examination:  Inspection:  Overall, sacoiliac joint benign and hips nontender; without crepitus or defects.   Thoracic spine inspection: Alignment normal without kyphosis present   Lumbar spine inspection:  Alignment  with normal lumbar lordosis, without scoliosis apparent.   Thoracic spine palpation:  without tenderness of spinal processes   Lumbar spine palpation: without tenderness of lumbar area; without tightness of lumbar muscles    Range of Motion:   Lumbar flexion, forward flexion is normal without pain or tenderness    Lumbar extension is full without pain or tenderness   Left lateral bend is normal without pain or tenderness   Right lateral bend is normal without pain or tenderness   Straight leg raising is normal  Strength & tone: normal   Stability overall normal stability  Encounter Diagnoses  Name Primary?   Chronic bilateral low back pain with bilateral sciatica Yes   Chronic pain syndrome    I will refill his pain medicine on Wednesdays as usual.  Call if any problem.  Precautions discussed.  Return in six weeks.  Electronically Signed Darreld Mclean, MD 6/11/20248:36 AM

## 2023-01-04 ENCOUNTER — Telehealth: Payer: Self-pay

## 2023-01-04 MED ORDER — OXYCODONE-ACETAMINOPHEN 5-325 MG PO TABS: ORAL_TABLET | ORAL | 0 refills | Status: DC

## 2023-01-05 NOTE — Telephone Encounter (Signed)
Med refill sent

## 2023-01-11 ENCOUNTER — Telehealth: Payer: Self-pay

## 2023-01-11 NOTE — Telephone Encounter (Signed)
Oxycodone-Acetaminophen 5/325 MG  Qty 25 Tablets    PATIENT USES [facility]

## 2023-01-12 MED ORDER — OXYCODONE-ACETAMINOPHEN 5-325 MG PO TABS: ORAL_TABLET | ORAL | 0 refills | Status: DC

## 2023-01-17 ENCOUNTER — Telehealth: Payer: Self-pay

## 2023-01-17 NOTE — Telephone Encounter (Signed)
Dr. Sanjuan Dame patient-------Oxycodone-Acetaminophen 5/325 MG Qty 25 Tablets  PATIENT USES [facility]

## 2023-01-18 MED ORDER — OXYCODONE-ACETAMINOPHEN 5-325 MG PO TABS: ORAL_TABLET | ORAL | 0 refills | Status: DC

## 2023-01-24 ENCOUNTER — Telehealth: Payer: Self-pay | Admitting: Orthopaedic Surgery

## 2023-01-24 NOTE — Telephone Encounter (Signed)
Dr. Sanjuan Dame pt - pt lvm requesting a refill on Oxycodone to be sent to [facility] in [place].

## 2023-01-25 MED ORDER — OXYCODONE-ACETAMINOPHEN 5-325 MG PO TABS: ORAL_TABLET | ORAL | 0 refills | Status: DC

## 2023-01-27 ENCOUNTER — Emergency Department (HOSPITAL_COMMUNITY): Payer: 59

## 2023-01-27 ENCOUNTER — Other Ambulatory Visit: Payer: Self-pay

## 2023-01-27 ENCOUNTER — Encounter (HOSPITAL_COMMUNITY): Payer: Self-pay | Admitting: Emergency Medicine

## 2023-01-27 ENCOUNTER — Emergency Department (HOSPITAL_COMMUNITY)
Admission: EM | Admit: 2023-01-27 | Discharge: 2023-01-27 | Disposition: A | Discharge status: Home / Self Care | Payer: 59 | Source: Home / Self Care | Attending: Emergency Medicine | Admitting: Emergency Medicine

## 2023-01-27 DIAGNOSIS — L0291 Cutaneous abscess, unspecified: Secondary | ICD-10-CM

## 2023-01-27 DIAGNOSIS — L02414 Cutaneous abscess of left upper limb: Secondary | ICD-10-CM | POA: Diagnosis present

## 2023-01-27 MED ORDER — LIDOCAINE HCL (PF) 2 % IJ SOLN
INTRAMUSCULAR | Status: AC
  Administered 2023-01-27: 5 mL
  Filled 2023-01-27: qty 5

## 2023-01-27 MED ORDER — LIDOCAINE HCL (PF) 2 % IJ SOLN: 10.0000 mL | Freq: Once | INTRAMUSCULAR | Status: AC

## 2023-01-27 MED ORDER — CLINDAMYCIN HCL 150 MG PO CAPS
300.0000 mg | ORAL_CAPSULE | Freq: Once | ORAL | Status: AC
  Administered 2023-01-27: 300 mg via ORAL
  Filled 2023-01-27: qty 2

## 2023-01-27 MED ORDER — CEPHALEXIN 500 MG PO CAPS: 500.0000 mg | ORAL_CAPSULE | Freq: Once | ORAL | Status: DC

## 2023-01-27 MED ORDER — SULFAMETHOXAZOLE-TRIMETHOPRIM 800-160 MG PO TABS: 1.0000 | ORAL_TABLET | Freq: Once | ORAL | Status: DC

## 2023-01-27 MED ORDER — CLINDAMYCIN HCL 150 MG PO CAPS: 300.0000 mg | ORAL_CAPSULE | Freq: Four times a day (QID) | ORAL | 0 refills | Status: DC

## 2023-01-27 NOTE — Discharge Instructions (Addendum)
All antibiotics can cause sensitivity to sunlight.  It is recommended that you cover up your skin when you are outside and use a sunblock until the antibiotic is finished.

## 2023-01-27 NOTE — ED Provider Notes (Signed)
[place] EMERGENCY DEPARTMENT AT [facility] Provider Note   CSN: [id number] Arrival date & time: 01/27/23  4098     History  Chief Complaint  Patient presents with   Wound Infection    Shawn Pittman is a 57 y.o. male.  Presents with pain and swelling of the left arm.  Patient reports that he had multiple small injuries on his arms after cutting metal with a grinder.  He started having increased pain and swelling of the left upper arm.  He is putting oil salve on it and it has started to point.  He poked it with a needle, got some yellow drainage out but it immediately stopped.       Home Medications Prior to Admission medications   Medication Sig Start Date End Date Taking? Authorizing Provider  clindamycin (CLEOCIN) 150 MG capsule Take 2 capsules (300 mg total) by mouth 4 (four) times daily. 01/27/23  Yes Daivion Pape, Canary Brim, MD  ALPRAZolam Prudy Feeler) 0.5 MG tablet Take 0.5 mg by mouth 3 (three) times daily as needed. 09/05/22   [provider]  atorvastatin (LIPITOR) 20 MG tablet Take 1 tablet by mouth once daily 02/03/20   Jacquelin Hawking, PA-C  buPROPion (WELLBUTRIN XL) 150 MG 24 hr tablet Take 150 mg by mouth daily. 09/05/22   [provider]  diclofenac (VOLTAREN) 75 MG EC tablet Take 1 tablet (75 mg total) by mouth 2 (two) times daily with a meal. 12/28/22   Ranelle Oyster, MD  lisinopril (ZESTRIL) 20 MG tablet Take 2 tablets (40 mg total) by mouth daily. 08/05/19   Jacquelin Hawking, PA-C  oxyCODONE-acetaminophen (PERCOCET/ROXICET) 5-325 MG tablet One tablet every four hours as need for pain 01/25/23   Darreld Mclean, MD      Allergies    Patient has no known allergies.    Review of Systems   Review of Systems  Physical Exam Updated Vital Signs BP (!) 171/90   Pulse 77   Resp 18   Wt 102.1 kg   SpO2 97%   BMI 32.28 kg/m  Physical Exam Vitals and nursing note reviewed.  Constitutional:      Appearance: Normal appearance.  HENT:      Head: Atraumatic.  Musculoskeletal:     Left upper arm: Swelling and tenderness present.  Skin:    Findings: Abrasion (Multiple scabs on the forearm and upper arm) and abscess (Tender, indurated, slightly pointing fleck joint area over distal lateral aspect of left bicep) present.  Neurological:     Mental Status: He is alert.     ED Results / Procedures / Treatments   Labs (all labs ordered are listed, but only abnormal results are displayed) Labs Reviewed - No data to display  EKG None  Radiology DG Humerus Left  Result Date: 01/27/2023 CLINICAL DATA:  Skin infection at distal humerus, middle and scan. EXAM: LEFT HUMERUS - 2+ VIEW COMPARISON:  None Available. FINDINGS: There is no evidence of acute fracture or other focal bone lesions. There is bony deformity of the distal clavicle, likely related to old trauma. No periosteal elevation or bony erosion is seen. Soft tissue swelling is noted about the visualized arm. A 6 mm linear radiopaque density projects over the medial aspect of the distal humerus on the lateral view. IMPRESSION: 1. No acute osseous abnormality. 2. 6 mm linear density along the medial aspect of the distal humerus, concerning for foreign body. Electronically Signed   By: Charlestine Night.D.  On: 01/27/2023 04:32    Procedures .Marland KitchenIncision and Drainage  Date/Time: 01/27/2023 5:02 AM  Performed by: Gilda Crease, MD Authorized by: Gilda Crease, MD   Consent:    Consent obtained:  Verbal   Consent given by:  Patient   Risks, benefits, and alternatives were discussed: yes     Risks discussed:  Bleeding, incomplete drainage and pain Universal protocol:    Procedure explained and questions answered to patient or proxy's satisfaction: yes     Relevant documents present and verified: yes     Test results available : yes     Imaging studies available: yes     Required blood products, implants, devices, and special equipment available: yes      Site/side marked: yes     Immediately prior to procedure, a time out was called: yes     Patient identity confirmed:  Verbally with patient Location:    Type:  Abscess   Size:  2cm   Location:  Upper extremity   Upper extremity location:  Arm   Arm location:  L upper arm Pre-procedure details:    Skin preparation:  Povidone-iodine Sedation:    Sedation type:  None Anesthesia:    Anesthesia method:  Local infiltration   Local anesthetic:  Lidocaine 2% w/o epi Procedure type:    Complexity:  Simple Procedure details:    Ultrasound guidance: no     Needle aspiration: no     Incision types:  Single straight   Wound management:  Probed and deloculated, irrigated with saline and extensive cleaning   Drainage:  Purulent   Drainage amount:  Copious   Wound treatment:  Wound left open   Packing materials:  None Post-procedure details:    Procedure completion:  Tolerated well, no immediate complications     Medications Ordered in ED Medications  clindamycin (CLEOCIN) capsule 300 mg (has no administration in time range)  lidocaine HCl (PF) (XYLOCAINE) 2 % injection 10 mL (5 mLs Infiltration Given 01/27/23 0450)    ED Course/ Medical Decision Making/ A&P                             Medical Decision Making Amount and/or Complexity of Data Reviewed Radiology: ordered.  Risk Prescription drug management.   Differential diagnosis considered includes, but not limited to: Cellulitis; abscess; foreign body  X-ray does show a very small radiopaque foreign body.  It is 6 mm in length but submillimeter in width.  Explained to patient that I would not likely be able to find this but it might come out with drainage.  Incision and drainage was performed.  An extensive amount of pus did expressed from the wound.  The cavity then was probed, irrigated.  I did not see the foreign body came out, however there was so much pus I doubt I would have seen this small foreign body if it did come  out.  Will place on antibiotics.  Given return precautions.  I would have like to have placed him on doxycycline or Bactrim, however he works extensively outside and in the sun.  Both of these antibiotics can cause light sensitivity.  Prescribed clindamycin.        Final Clinical Impression(s) / ED Diagnoses Final diagnoses:  Abscess    Rx / DC Orders ED Discharge Orders          Ordered    clindamycin (CLEOCIN) 150 MG capsule  4  times daily        01/27/23 0510              Gilda Crease, MD 01/27/23 [phone]

## 2023-01-27 NOTE — ED Triage Notes (Signed)
Pt in with quarter-sized pus-filled wound to crease of L upper arm. Pt states about a week ago, he was cutting some metal and his arm got cut with metal shrapnel. Area of warmth, redness and swelling has since increased. States he has a hx of MRSA. Not currently on abx, denies fevers or chills

## 2023-01-31 ENCOUNTER — Telehealth: Payer: Self-pay | Admitting: Orthopaedic Surgery

## 2023-01-31 NOTE — Telephone Encounter (Signed)
Dr. Keeling's pt - pt lvm requesting a refill on Oxycodone 5-325 to be sent to [facility] [place] 

## 2023-02-01 MED ORDER — OXYCODONE-ACETAMINOPHEN 5-325 MG PO TABS: ORAL_TABLET | ORAL | 0 refills | Status: DC

## 2023-02-07 ENCOUNTER — Telehealth: Payer: Self-pay | Admitting: Orthopaedic Surgery

## 2023-02-07 NOTE — Telephone Encounter (Signed)
Dr. Sanjuan Dame pt - pt lvm requesting a refill for Oxycodone 5-325 to be sent to [facility]

## 2023-02-08 ENCOUNTER — Ambulatory Visit: Payer: 59 | Admitting: Physical Medicine & Rehabilitation

## 2023-02-08 MED ORDER — OXYCODONE-ACETAMINOPHEN 5-325 MG PO TABS: ORAL_TABLET | ORAL | 0 refills | Status: DC

## 2023-02-14 ENCOUNTER — Encounter: Payer: Self-pay | Admitting: Orthopaedic Surgery

## 2023-02-14 ENCOUNTER — Ambulatory Visit: Payer: 59 | Admitting: Orthopaedic Surgery

## 2023-02-14 ENCOUNTER — Other Ambulatory Visit (INDEPENDENT_AMBULATORY_CARE_PROVIDER_SITE_OTHER): Payer: 59

## 2023-02-14 ENCOUNTER — Telehealth: Payer: Self-pay

## 2023-02-14 VITALS — BP 206/106 | HR 81 | Ht 70.0 in | Wt 235.0 lb

## 2023-02-14 DIAGNOSIS — G8929 Other chronic pain: Secondary | ICD-10-CM

## 2023-02-14 DIAGNOSIS — M5442 Lumbago with sciatica, left side: Secondary | ICD-10-CM | POA: Diagnosis not present

## 2023-02-14 DIAGNOSIS — M5441 Lumbago with sciatica, right side: Secondary | ICD-10-CM | POA: Diagnosis not present

## 2023-02-14 DIAGNOSIS — G894 Chronic pain syndrome: Secondary | ICD-10-CM

## 2023-02-14 DIAGNOSIS — M25562 Pain in left knee: Secondary | ICD-10-CM

## 2023-02-14 MED ORDER — METHYLPREDNISOLONE ACETATE 40 MG/ML IJ SUSP
40.0000 mg | Freq: Once | INTRAMUSCULAR | Status: AC
  Administered 2023-02-14: 40 mg via INTRA_ARTICULAR

## 2023-02-14 NOTE — Addendum Note (Signed)
Addended byCaffie Damme on: 02/14/2023 08:37 AM   Modules accepted: Orders

## 2023-02-14 NOTE — Progress Notes (Signed)
My knee is hurting.  He has developed pain of the left knee posteriorly.  He has no trauma.  He has had prior surgery on the knee in the past.  He has no giving way, some swelling is present and he has some popping.    He saw Dr. Sherril Croon and was discharged from his practice two weeks ago.  I have told him to find a new family doctor.  The left knee has some tenderness medial hamstring, slight effusion, crepitus, ROM is nearly full, no limp, stable, NV intact.  X-rays were done of the left knee, reported separately.  Encounter Diagnoses  Name Primary?   Acute pain of left knee Yes   Chronic bilateral low back pain with bilateral sciatica    Chronic pain syndrome    PROCEDURE NOTE:  The patient requests injections of the left knee , verbal consent was obtained.  The left knee was prepped appropriately after time out was performed.   Sterile technique was observed and injection of 1 cc of DepoMedrol 40 mg with several cc's of plain xylocaine. Anesthesia was provided by ethyl chloride and a 20-gauge needle was used to inject the knee area. The injection was tolerated well.  A band aid dressing was applied.  The patient was advised to apply ice later today and tomorrow to the injection sight as needed.  I will refill his pain medicine tomorrow.  Return in one month.  Call if any problem.  Precautions discussed.  Electronically Signed Darreld Mclean, MD 7/23/20248:32 AM

## 2023-02-14 NOTE — Telephone Encounter (Signed)
Oxycodone-Acetaminophen 5/325 MG Qty 25 Tablets  PATIENT USES [facility] IN [place]

## 2023-02-15 MED ORDER — OXYCODONE-ACETAMINOPHEN 5-325 MG PO TABS: ORAL_TABLET | ORAL | 0 refills | Status: DC

## 2023-02-22 ENCOUNTER — Telehealth: Payer: Self-pay

## 2023-02-22 MED ORDER — OXYCODONE-ACETAMINOPHEN 5-325 MG PO TABS: ORAL_TABLET | ORAL | 0 refills | Status: DC

## 2023-02-22 NOTE — Telephone Encounter (Signed)
Completed.

## 2023-03-02 ENCOUNTER — Telehealth: Payer: Self-pay

## 2023-03-02 MED ORDER — OXYCODONE-ACETAMINOPHEN 5-325 MG PO TABS: ORAL_TABLET | ORAL | 0 refills | Status: DC

## 2023-03-02 NOTE — Telephone Encounter (Signed)
Oxycodone-Acetaminophen 5/325 MG  Qty 25 Tablets    PATIENT USES [facility]

## 2023-03-07 ENCOUNTER — Telehealth: Payer: Self-pay | Admitting: Orthopaedic Surgery

## 2023-03-07 MED ORDER — OXYCODONE-ACETAMINOPHEN 5-325 MG PO TABS: ORAL_TABLET | ORAL | 0 refills | Status: DC

## 2023-03-07 NOTE — Telephone Encounter (Signed)
Dr. Sanjuan Dame pt - pt lvm requesting a refill on Oxycodone 5-325 be sent to [facility] in [place]

## 2023-03-14 ENCOUNTER — Other Ambulatory Visit: Payer: Self-pay

## 2023-03-14 ENCOUNTER — Ambulatory Visit: Payer: 59 | Admitting: Orthopaedic Surgery

## 2023-03-14 MED ORDER — OXYCODONE-ACETAMINOPHEN 5-325 MG PO TABS: ORAL_TABLET | ORAL | 0 refills | Status: DC

## 2023-03-14 NOTE — Progress Notes (Signed)
Office Visit Note  Patient: Shawn Pittman             Date of Birth: Oct 01, 1965           MRN: [id number]             PCP: Patient, No Pcp Per Referring: Darreld Mclean, MD Visit Date: 03/15/2023 Occupation: Holiday representative  Subjective:  New Patient (Initial Visit) (Patient states he has had staph infection in his left knee and had is taken out. Patient states he has an extra knee cap or calcium build up in his left knee. Patient states he has some musscle tenderness behind his left knee. Patient states his hands have been tore up and do not work like they used to. Patient states he has broken multiple fingers. )   History of Present Illness: Shawn Pittman is a 57 y.o. male here for evaluation of positive ANA checked in association with chronic joint pain of multiple areas.  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  He does have widespread joint pain and stiffness lasting for up to 2 hours in the morning with partial improvement going through the rest of the day.  Does have use related pain as well.  He sometimes sees joint swelling no associated redness and not hot to the touch.  He sees Dr. Darreld Mclean for chronic pain management of this extensive osteoarthritis on oral oxycodone with close monitoring due to previous substance use disorder also takes diclofenac 75 mg sometimes which is beneficial.  He gets joint injections intermittently last was a left knee steroid injection about 1 month ago which does help him considerably. He has not noticed any new problems with skin rashes, oral nasal ulcers, lymphadenopathy, Raynaud's symptoms, abnormal bruising or  bleeding.  Labs reviewed 09/2022 ANA 1:80 homogenous dsDNA 6 Sm, chromatin neg RF neg ESR 6  Imaging reviewed 11/28/22 Xray right ribs IMPRESSION: Old healed fractures of RIGHT fourth through eighth ribs. No acute osseous abnormalities.  09/08/22 Xray right wrist Impression: healing radial styloid fracture nondisplaced. Degenerative changes of the right wrist.   10/05/21 Xray right hand Impression:  Fusiform swelling of PIP joints and slight decrease in joint spaces, old fracture PIP joint of little finger with deformity.   10/05/21 Xray left hand Impression: slight fusiform swelling of fingers and slight decreased joint spaces of PIP joints.   06/18/19 MRI Cervical spine IMPRESSION: Disc degeneration from C3-4 to C6-7 with up to mild spinal stenosis causing mild ventral cord deformity at C5-6.  Activities of Daily Living:  Patient reports morning stiffness for 2 hours.   Patient Reports nocturnal pain.  Difficulty dressing/grooming: Denies Difficulty climbing stairs: Denies Difficulty getting out of chair: Reports Difficulty using hands for taps, buttons, cutlery, and/or writing: Reports  Review of Systems  Constitutional:  Negative for fatigue.  HENT:  Negative for mouth sores and mouth dryness.   Eyes:  Negative for dryness.  Respiratory:  Negative for shortness of breath.   Cardiovascular:  Negative for chest pain and palpitations.  Gastrointestinal:  Positive for constipation. Negative for blood in stool and diarrhea.  Endocrine: Negative for increased urination.  Genitourinary:  Negative for involuntary urination.  Musculoskeletal:  Positive for joint pain, joint pain,  joint swelling, myalgias, morning stiffness, muscle tenderness and myalgias. Negative for gait problem and muscle weakness.  Skin:  Negative for color change, rash, hair loss and sensitivity to sunlight.  Allergic/Immunologic: Negative for susceptible to infections.  Neurological:  Positive for  dizziness and headaches.  Hematological:  Negative for swollen glands.  Psychiatric/Behavioral:  Negative for depressed mood and sleep disturbance. The patient is nervous/anxious.     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 03/15/2023   Bilateral hand pain 03/15/2023   Dyspepsia 03/15/2023   Spondylosis of lumbar spine 12/28/2022   Polyarthritis 12/28/2022   Activities involving gardening and landscaping 05/29/2020   Overexertion and strenuous and repetitive movements or loads 05/29/2020   Pain in joint, pelvic region and thigh 05/29/2020   Sprain and strain of shoulder and upper arm 05/29/2020   Sprain lumbar region 05/29/2020   Thoracic aortic aneurysm without rupture (HCC) 08/02/2018   BACK PAIN 06/24/2010   HYPERCHOLESTEROLEMIA 06/15/2010   OBESITY 06/15/2010   CELLULITIS, ARM 12/29/2009   CLOSED FRACTURE UNSPEC PHALANX/PHALANGES HAND 09/22/2009   METHICILLIN RESISTANT STAPHYLOCOCCUS AUREUS INFECTION 09/15/2009   ANXIETY DEPRESSION 09/15/2009   HYPERTENSION, BENIGN ESSENTIAL 09/15/2009   HYPERGLYCEMIA 09/15/2009    Past Medical History:  Diagnosis Date   Back pain, chronic    Hyperlipidemia    Hypertension    MRSA (methicillin resistant Staphylococcus aureus)     Family History  Problem Relation Age of Onset   Heart attack Mother    Cancer Father    Past Surgical History:  Procedure Laterality Date   EYE SURGERY  1989   removed R eye   Social History   Social History Narrative   Not on file   Immunization History  Administered Date(s) Administered   Influenza Split 06/25/2018   Influenza Whole 05/12/2010     Objective: Vital Signs: BP (!) 163/96 (BP Location: Right Arm, Patient Position: Sitting, Cuff Size: Normal)   Pulse 75   Resp 12   Ht 5\' 10"  (1.778 m)   Wt 235 lb (106.6 kg)   BMI 33.72 kg/m    Physical Exam HENT:      Mouth/Throat:     Mouth: Mucous membranes are moist.     Pharynx: Oropharynx is clear.  Eyes:     Comments: Right eye absent  Cardiovascular:     Rate and Rhythm: Normal rate and regular rhythm.  Pulmonary:     Effort: Pulmonary effort is normal.     Breath sounds: Normal breath sounds.  Musculoskeletal:     Right lower leg: No edema.     Left lower leg: No edema.  Skin:    General: Skin is warm and dry.     Findings: No rash.  Neurological:     Mental Status: He is alert.  Psychiatric:        Mood and Affect: Mood normal.      Musculoskeletal Exam:  Shoulders full ROM, some crepitus no palpable swelling Elbows full ROM no tenderness or swelling Wrists full ROM no tenderness or swelling Right wrist mild pain radiation with percussion Fingers chronic bony and soft tissue thickening proximal and distal joints, no focal synovitis appreciable, right 5th digit boutonniere deformity Hip internal and external rotation ROM normal, no lateral tenderness to pressure Knees full ROM, crepitus, no effusion, bony nodularity anterior on left patella Ankles full ROM no tenderness or swelling  Mild cocked up toe deformities worse on left foot 2nd-5th digits, no swelling or focal tenderness   Investigation: No additional findings.  Imaging: DG Knee 3 Views Left  Result Date: 02/14/2023 Clinical: pain posterior left knee, no trauma X-rays were done of the left knee, three views. There is good alignment of the left knee. No fracture or loose body noted.  Bone quality is good. There is heterotrophic bone formation on the anterior patella with a small loose fragment distally. Impression:  heterotrophic bone formation anterior patella, no fracture noted. Electronically Signed Darreld Mclean, MD 7/23/20248:25 AM   Recent Labs: Lab Results  Component Value Date   WBC 5.9 10/19/2022   HGB 15.0 10/19/2022   PLT 206 10/19/2022   NA 140 11/04/2019   K 5.0 11/04/2019   CL 105 11/04/2019   CO2 29  11/04/2019   GLUCOSE 123 (H) 11/04/2019   BUN 17 11/04/2019   CREATININE 0.84 11/04/2019   BILITOT 0.4 11/04/2019   ALKPHOS 61 11/04/2019   AST 20 11/04/2019   ALT 21 11/04/2019   PROT 7.3 11/04/2019   ALBUMIN 4.1 11/04/2019   CALCIUM 9.0 11/04/2019   GFRAA >60 11/04/2019    Speciality Comments: No specialty comments available.  Procedures:  No procedures performed Allergies: Patient has no known allergies.   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  Does not prevent with specific clinical criteria for SLE.  There is considerable degenerative and posttraumatic arthritis that may alternately explain symptoms and no overt synovitis present today.  Check specific antibody markers including repeat double-stranded DNA titer also CCP complements sed rate and CRP for evidence of systemic inflammation.  If significantly elevated could follow-up to discuss targeted therapy for inflammatory arthritis otherwise would have very low clinical suspicion.  Bilateral hand pain  Extensive degenerative and posttraumatic changes on reviewed x-rays.  No peripheral joint synovitis appreciable on exam today.  Spondylosis of lumbar spine  Low back and radicular symptoms are some of his worst problem but he is absolutely not interested in any surgical intervention for this.  Has ongoing follow-up with orthopedics.  Dyspepsia  Has some upper GI sounding symptoms that could be exacerbating or limiting the use of diclofenac for pain control.  Although with concurrent antihypertensive treatment including ACE inhibitors probably better not increasing the current frequency.  Orders: Orders Placed This  Encounter  Procedures   Sedimentation rate   C-reactive protein   Anti-Smith antibody   Anti-DNA antibody, double-stranded   C3 and C4   Cyclic citrul peptide antibody, IgG   No orders of the defined types were placed in this encounter.    Follow-Up Instructions: No follow-ups on file.   Fuller Plan, MD  Note - This record has been created using [employer].  Chart creation errors have been sought, but may not always  have been located. Such creation errors do not reflect on  the standard of medical care.

## 2023-03-14 NOTE — Telephone Encounter (Signed)
Dr. Hilda Lias pt------------------Oxycodone-Acetaminophen 5/325 MG   Qty 25 Tablets  One tablet every four hours as need for pain   PATIENT USES [facility]

## 2023-03-15 ENCOUNTER — Encounter: Payer: Self-pay | Admitting: Internal Medicine

## 2023-03-15 ENCOUNTER — Ambulatory Visit: Payer: MEDICAID | Attending: Internal Medicine | Admitting: Internal Medicine

## 2023-03-15 VITALS — BP 161/89 | HR 71 | Resp 12 | Ht 70.0 in | Wt 235.0 lb

## 2023-03-15 DIAGNOSIS — R768 Other specified abnormal immunological findings in serum: Secondary | ICD-10-CM | POA: Diagnosis not present

## 2023-03-15 DIAGNOSIS — R7689 Other specified abnormal immunological findings in serum: Secondary | ICD-10-CM | POA: Insufficient documentation

## 2023-03-15 DIAGNOSIS — M79641 Pain in right hand: Secondary | ICD-10-CM | POA: Diagnosis not present

## 2023-03-15 DIAGNOSIS — M79642 Pain in left hand: Secondary | ICD-10-CM

## 2023-03-15 DIAGNOSIS — R1013 Epigastric pain: Secondary | ICD-10-CM

## 2023-03-15 DIAGNOSIS — M47816 Spondylosis without myelopathy or radiculopathy, lumbar region: Secondary | ICD-10-CM

## 2023-03-16 LAB — C-REACTIVE PROTEIN: CRP: 4.3 mg/L (ref <8.0)

## 2023-03-16 LAB — CYCLIC CITRUL PEPTIDE ANTIBODY, IGG: Cyclic Citrullin Peptide Ab: <16 UNITS

## 2023-03-16 LAB — ANTI-SMITH ANTIBODY: ENA SM Ab Ser-aCnc: <1 AI

## 2023-03-16 LAB — SEDIMENTATION RATE: Sed Rate: 6 mm/h (ref 0–20)

## 2023-03-16 LAB — C3 AND C4
C3 Complement: 176 mg/dL (ref 82–185)
C4 Complement: 30 mg/dL (ref 15–53)

## 2023-03-16 LAB — ANTI-DNA ANTIBODY, DOUBLE-STRANDED: ds DNA Ab: 5 [IU]/mL — ABNORMAL HIGH

## 2023-03-20 ENCOUNTER — Ambulatory Visit: Payer: 59 | Admitting: Family Medicine

## 2023-03-20 ENCOUNTER — Telehealth: Payer: Self-pay | Admitting: *Deleted

## 2023-03-20 NOTE — Telephone Encounter (Signed)
Shawn Pittman (Key: [id number]) PA Case ID #: [id number] Rx #: [id number] Need Help? Call us at [phone] Status sent iconSent to Plan today

## 2023-03-21 ENCOUNTER — Encounter: Payer: Self-pay | Admitting: General Practice

## 2023-03-21 ENCOUNTER — Ambulatory Visit (INDEPENDENT_AMBULATORY_CARE_PROVIDER_SITE_OTHER): Payer: MEDICAID | Admitting: Orthopaedic Surgery

## 2023-03-21 ENCOUNTER — Encounter: Payer: Self-pay | Admitting: Orthopaedic Surgery

## 2023-03-21 VITALS — BP 168/91 | HR 75 | Ht 70.0 in | Wt 230.0 lb

## 2023-03-21 DIAGNOSIS — G894 Chronic pain syndrome: Secondary | ICD-10-CM | POA: Diagnosis not present

## 2023-03-21 DIAGNOSIS — G8929 Other chronic pain: Secondary | ICD-10-CM

## 2023-03-21 DIAGNOSIS — M5441 Lumbago with sciatica, right side: Secondary | ICD-10-CM | POA: Diagnosis not present

## 2023-03-21 DIAGNOSIS — M5442 Lumbago with sciatica, left side: Secondary | ICD-10-CM

## 2023-03-21 DIAGNOSIS — M25562 Pain in left knee: Secondary | ICD-10-CM | POA: Diagnosis not present

## 2023-03-21 NOTE — Telephone Encounter (Signed)
Approved on August 26 by [employer] 2017 Your request has been approved Authorization Expiration Date: 03/18/2024

## 2023-03-21 NOTE — Progress Notes (Signed)
My knee is better.  His left knee is improved and he has less pain.  The injection helped.  His lower back pain is still present, constant.  He has no weakness, no new trauma.  He is taking his pain medicine.  Left knee has near full ROM today and slight crepitus, minimal effusion.  NV intact.  No limp.  Spine/Pelvis examination:  Inspection:  Overall, sacoiliac joint benign and hips nontender; without crepitus or defects.   Thoracic spine inspection: Alignment normal without kyphosis present   Lumbar spine inspection:  Alignment  with normal lumbar lordosis, without scoliosis apparent.   Thoracic spine palpation:  without tenderness of spinal processes   Lumbar spine palpation: without tenderness of lumbar area; without tightness of lumbar muscles    Range of Motion:   Lumbar flexion, forward flexion is normal without pain or tenderness    Lumbar extension is full without pain or tenderness   Left lateral bend is normal without pain or tenderness   Right lateral bend is normal without pain or tenderness   Straight leg raising is normal  Strength & tone: normal   Stability overall normal stability  Encounter Diagnoses  Name Primary?   Chronic bilateral low back pain with bilateral sciatica Yes   Acute pain of left knee    Chronic pain syndrome    Return in six weeks.  Call if any problem.  Precautions discussed.  Electronically Signed Darreld Mclean, MD 8/27/20248:24 AM;

## 2023-03-22 ENCOUNTER — Telehealth: Payer: Self-pay

## 2023-03-22 MED ORDER — OXYCODONE-ACETAMINOPHEN 5-325 MG PO TABS: ORAL_TABLET | ORAL | 0 refills | Status: DC

## 2023-03-23 NOTE — Telephone Encounter (Signed)
error 

## 2023-03-28 ENCOUNTER — Telehealth: Payer: Self-pay

## 2023-03-28 ENCOUNTER — Telehealth: Payer: Self-pay | Admitting: Orthopaedic Surgery

## 2023-03-28 MED ORDER — OXYCODONE-ACETAMINOPHEN 5-325 MG PO TABS: ORAL_TABLET | ORAL | 0 refills | Status: DC

## 2023-03-28 NOTE — Telephone Encounter (Signed)
Dr. Keeling's pt - pt lvm requesting a refill on Oxycodone 5-325 to be sent to [facility] in [place] 

## 2023-03-28 NOTE — Telephone Encounter (Signed)
Done

## 2023-04-04 ENCOUNTER — Telehealth: Payer: Self-pay | Admitting: Orthopaedic Surgery

## 2023-04-04 MED ORDER — OXYCODONE-ACETAMINOPHEN 5-325 MG PO TABS: ORAL_TABLET | ORAL | 0 refills | Status: DC

## 2023-04-04 NOTE — Telephone Encounter (Signed)
Patient called lvm needs his pain medicine refilled   oxyCODONE-acetaminophen (PERCOCET/ROXICET) 5-325 MG tablet   Pharmacy  [facility] [address] [place] -

## 2023-04-05 ENCOUNTER — Encounter: Payer: Self-pay | Admitting: *Deleted

## 2023-04-11 ENCOUNTER — Telehealth: Payer: Self-pay | Admitting: Orthopaedic Surgery

## 2023-04-11 MED ORDER — OXYCODONE-ACETAMINOPHEN 5-325 MG PO TABS: ORAL_TABLET | ORAL | 0 refills | Status: DC

## 2023-04-11 NOTE — Telephone Encounter (Signed)
Dr. Sanjuan Dame pt - pt lvm requesting a refill on his Oxycodone 5-325 to be sent to [facility]

## 2023-04-18 ENCOUNTER — Telehealth: Payer: Self-pay | Admitting: Orthopaedic Surgery

## 2023-04-18 MED ORDER — OXYCODONE-ACETAMINOPHEN 5-325 MG PO TABS: ORAL_TABLET | ORAL | 0 refills | Status: DC

## 2023-04-18 NOTE — Telephone Encounter (Signed)
Dr. Sanjuan Dame pt - pt lvm requesting a refill on his Oxycodone 5-325 to be sent to [facility] in [place]

## 2023-04-25 ENCOUNTER — Telehealth: Payer: Self-pay | Admitting: Orthopaedic Surgery

## 2023-04-25 NOTE — Telephone Encounter (Signed)
Dr. Keeling's pt - pt lvm requesting a refill on Oxycodone 5-325 to be sent to [facility] in [place] 

## 2023-04-26 MED ORDER — OXYCODONE-ACETAMINOPHEN 5-325 MG PO TABS: ORAL_TABLET | ORAL | 0 refills | Status: DC

## 2023-05-02 ENCOUNTER — Telehealth: Payer: Self-pay

## 2023-05-02 ENCOUNTER — Ambulatory Visit: Payer: MEDICAID | Admitting: Orthopaedic Surgery

## 2023-05-02 MED ORDER — OXYCODONE-ACETAMINOPHEN 5-325 MG PO TABS: ORAL_TABLET | ORAL | 0 refills | Status: DC

## 2023-05-02 NOTE — Telephone Encounter (Signed)
Patient came in this morning and he was rescheduled he was at the hospital the weekend and was cut in 2 places for biopsy possible staff infection and is on antibiotics and will call and rescheduld as soon as results come in please put him in asap

## 2023-05-02 NOTE — Telephone Encounter (Signed)
Complete

## 2023-05-09 ENCOUNTER — Telehealth (HOSPITAL_BASED_OUTPATIENT_CLINIC_OR_DEPARTMENT_OTHER): Payer: Self-pay | Admitting: Radiology

## 2023-05-09 MED ORDER — OXYCODONE-ACETAMINOPHEN 5-325 MG PO TABS: ORAL_TABLET | ORAL | 0 refills | Status: DC

## 2023-05-16 ENCOUNTER — Other Ambulatory Visit (INDEPENDENT_AMBULATORY_CARE_PROVIDER_SITE_OTHER): Payer: MEDICAID

## 2023-05-16 ENCOUNTER — Ambulatory Visit (INDEPENDENT_AMBULATORY_CARE_PROVIDER_SITE_OTHER): Payer: MEDICAID | Admitting: Orthopaedic Surgery

## 2023-05-16 ENCOUNTER — Encounter: Payer: Self-pay | Admitting: Orthopaedic Surgery

## 2023-05-16 VITALS — BP 159/98 | HR 70 | Ht 70.0 in | Wt 250.0 lb

## 2023-05-16 DIAGNOSIS — M5442 Lumbago with sciatica, left side: Secondary | ICD-10-CM

## 2023-05-16 DIAGNOSIS — M5441 Lumbago with sciatica, right side: Secondary | ICD-10-CM

## 2023-05-16 DIAGNOSIS — M25511 Pain in right shoulder: Secondary | ICD-10-CM | POA: Diagnosis not present

## 2023-05-16 DIAGNOSIS — G8929 Other chronic pain: Secondary | ICD-10-CM | POA: Diagnosis not present

## 2023-05-16 DIAGNOSIS — F419 Anxiety disorder, unspecified: Secondary | ICD-10-CM | POA: Insufficient documentation

## 2023-05-16 MED ORDER — OXYCODONE-ACETAMINOPHEN 5-325 MG PO TABS: ORAL_TABLET | ORAL | 0 refills | Status: DC

## 2023-05-16 MED ORDER — METHYLPREDNISOLONE ACETATE 40 MG/ML IJ SUSP
40.0000 mg | Freq: Once | INTRAMUSCULAR | Status: AC
Start: 2023-05-16 — End: 2023-05-16
  Administered 2023-05-16: 40 mg via INTRA_ARTICULAR

## 2023-05-16 NOTE — Progress Notes (Signed)
My right shoulder is hurting.  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  He has no falls but has pain with overhead use.  He has no numbness, no redness, no swelling.  His back is the same with good and bad days.  He has no weakness, no trauma.  Right shoulder has near full but painful in the extremes ROM.  NV intact.  Neck has full ROM. Lower back is slightly tender, full ROM, muscle tone and strength normal.  NV intact.  Encounter Diagnoses  Name Primary?   Acute pain of right shoulder Yes   Chronic bilateral low back pain with bilateral sciatica    X-rays were done of the right shoulder, reported separately.  PROCEDURE NOTE:  The patient request injection, verbal consent was obtained.  The right shoulder was prepped appropriately after time out was performed.   Sterile technique was observed and injection of 1 cc of DepoMedrol 40mg  with several cc's of plain xylocaine. Anesthesia was provided by ethyl chloride and a 20-gauge needle was used to inject the shoulder area. A posterior approach was used.  The injection was tolerated well.  A band aid dressing was applied.  The patient was advised to apply ice later today and tomorrow to the injection sight as needed.  I have reviewed the [place] Controlled Substance Reporting System web site prior to prescribing narcotic medicine for this patient.  Return in one month.  Call if any problem.  Precautions discussed.  Electronically Signed Darreld Mclean, MD 10/22/20248:28 AM

## 2023-05-16 NOTE — Progress Notes (Signed)
b 

## 2023-05-22 ENCOUNTER — Telehealth: Payer: Self-pay

## 2023-05-22 NOTE — Telephone Encounter (Signed)
Yes the patient can come in tomorrow

## 2023-05-22 NOTE — Telephone Encounter (Signed)
Patient left message stating that the shot didn't help with his shoulder that it is actually getting worse. He wants to know if he can come in tomorrow and see if Dr. Hilda Lias will refer him to someone to help him with his shoulder.

## 2023-05-23 ENCOUNTER — Encounter: Payer: Self-pay | Admitting: Orthopaedic Surgery

## 2023-05-23 ENCOUNTER — Ambulatory Visit (INDEPENDENT_AMBULATORY_CARE_PROVIDER_SITE_OTHER): Payer: MEDICAID | Admitting: Orthopaedic Surgery

## 2023-05-23 VITALS — BP 186/113 | HR 80 | Ht 70.0 in | Wt 249.0 lb

## 2023-05-23 DIAGNOSIS — M25511 Pain in right shoulder: Secondary | ICD-10-CM

## 2023-05-23 DIAGNOSIS — M5442 Lumbago with sciatica, left side: Secondary | ICD-10-CM | POA: Diagnosis not present

## 2023-05-23 DIAGNOSIS — M5441 Lumbago with sciatica, right side: Secondary | ICD-10-CM | POA: Diagnosis not present

## 2023-05-23 DIAGNOSIS — G8929 Other chronic pain: Secondary | ICD-10-CM

## 2023-05-23 MED ORDER — OXYCODONE-ACETAMINOPHEN 5-325 MG PO TABS: ORAL_TABLET | ORAL | 0 refills | Status: DC

## 2023-05-23 MED ORDER — PREDNISONE 5 MG (21) PO TBPK: ORAL_TABLET | ORAL | 0 refills | Status: DC

## 2023-05-23 NOTE — Patient Instructions (Signed)
Central scheduling [phone] 

## 2023-05-23 NOTE — Addendum Note (Signed)
Addended by: Michaele Offer on: 05/23/2023 08:40 AM   Modules accepted: Orders

## 2023-05-23 NOTE — Progress Notes (Signed)
My shoulder is worse.  His right shoulder is hurting more.  The injection did not help that much.  He has pain with overhead use and rolling on it at night.  He has no redness, no swelling, no numbness.  Right shoulder has decreased ROM, forward 110, abduction 80, internal 20, external 15, extension 5, adduction full, NV intact, Pain to resisted abduction.  Encounter Diagnoses  Name Primary?   Acute pain of right shoulder Yes   Chronic bilateral low back pain with bilateral sciatica    I will give prednisone dose pack.  I will try to get MRI.  I am concerned about rotator cuff tear.  I will renew his pain medicine.  Keep appointment in three weeks.  Call if any problem.  Precautions discussed.  Electronically Signed Darreld Mclean, MD 10/29/20248:11 AM

## 2023-05-29 ENCOUNTER — Telehealth: Payer: Self-pay | Admitting: Orthopaedic Surgery

## 2023-05-29 NOTE — Telephone Encounter (Signed)
Dr. Sanjuan Dame pt - pt lvm stating that he has an upcoming a MRI and that insurance stated they are waiting on [place] for approval.  He had Medicaid [place] per the pt.

## 2023-05-29 NOTE — Telephone Encounter (Signed)
Called patient lvm that he is approved for his mri  approval number is #[id number]

## 2023-05-30 ENCOUNTER — Other Ambulatory Visit: Payer: Self-pay | Admitting: Orthopaedic Surgery

## 2023-05-30 MED ORDER — OXYCODONE-ACETAMINOPHEN 5-325 MG PO TABS: ORAL_TABLET | ORAL | 0 refills | Status: DC

## 2023-05-30 NOTE — Telephone Encounter (Signed)
Dr. Sanjuan Dame pt - spoke w/the pt, he is requesting a refill on Oxycodone 5-325, 25 tablets, One tablet every four hours as need for pain to be sent to [facility] in [place].

## 2023-05-31 ENCOUNTER — Ambulatory Visit: Payer: MEDICAID

## 2023-05-31 ENCOUNTER — Ambulatory Visit (HOSPITAL_COMMUNITY)
Admission: RE | Admit: 2023-05-31 | Discharge: 2023-05-31 | Disposition: A | Discharge status: Home / Self Care | Payer: MEDICAID | Source: Ambulatory Visit | Attending: Orthopaedic Surgery | Admitting: Orthopaedic Surgery

## 2023-05-31 DIAGNOSIS — M25511 Pain in right shoulder: Secondary | ICD-10-CM | POA: Diagnosis present

## 2023-05-31 DIAGNOSIS — M5441 Lumbago with sciatica, right side: Secondary | ICD-10-CM | POA: Diagnosis present

## 2023-05-31 DIAGNOSIS — G8929 Other chronic pain: Secondary | ICD-10-CM | POA: Diagnosis present

## 2023-05-31 DIAGNOSIS — M5442 Lumbago with sciatica, left side: Secondary | ICD-10-CM | POA: Diagnosis present

## 2023-06-06 ENCOUNTER — Telehealth: Payer: Self-pay

## 2023-06-06 MED ORDER — OXYCODONE-ACETAMINOPHEN 5-325 MG PO TABS: ORAL_TABLET | ORAL | 0 refills | Status: DC

## 2023-06-06 NOTE — Telephone Encounter (Signed)
Oxycodone-Acetaminophen 5/325 MG  Qty 25 Tablets    PATIENT USES [facility]

## 2023-06-13 ENCOUNTER — Ambulatory Visit: Payer: MEDICAID | Admitting: Orthopaedic Surgery

## 2023-06-13 ENCOUNTER — Telehealth: Payer: Self-pay | Admitting: Orthopaedic Surgery

## 2023-06-13 MED ORDER — OXYCODONE-ACETAMINOPHEN 5-325 MG PO TABS: ORAL_TABLET | ORAL | 0 refills | Status: DC

## 2023-06-13 NOTE — Telephone Encounter (Signed)
DR. Hilda Lias   Patient called at 6:27 AM left message for a refill on his pain medicine   oxyCODONE-acetaminophen (PERCOCET/ROXICET) 5-325 MG tablet

## 2023-06-14 ENCOUNTER — Encounter: Payer: Self-pay | Admitting: Orthopaedic Surgery

## 2023-06-14 ENCOUNTER — Ambulatory Visit: Payer: MEDICAID | Admitting: Orthopaedic Surgery

## 2023-06-14 DIAGNOSIS — M5441 Lumbago with sciatica, right side: Secondary | ICD-10-CM | POA: Diagnosis not present

## 2023-06-14 DIAGNOSIS — M75111 Incomplete rotator cuff tear or rupture of right shoulder, not specified as traumatic: Secondary | ICD-10-CM

## 2023-06-14 DIAGNOSIS — G8929 Other chronic pain: Secondary | ICD-10-CM

## 2023-06-14 DIAGNOSIS — M5442 Lumbago with sciatica, left side: Secondary | ICD-10-CM

## 2023-06-14 DIAGNOSIS — M25511 Pain in right shoulder: Secondary | ICD-10-CM

## 2023-06-14 DIAGNOSIS — G894 Chronic pain syndrome: Secondary | ICD-10-CM

## 2023-06-14 NOTE — Progress Notes (Signed)
My shoulder still hurts.  He had the MRI of the right shoulder showing: IMPRESSION: 1. Severe supraspinatus tendinosis with a partial-thickness bursal surface tear along the anterior-most aspect and a small partial-thickness articular surface tear. 2. Mild infraspinatus and subscapularis tendinosis.  I have explained the findings to him.  I will have him see Dr. Dallas Schimke for this.  I have independently reviewed the MRI.    He has chronic lower back pain that has been doing fairly well until the cold rainy day last week.  He is taking his medicine.  He has no weakness, no new trauma.  Lower back is tender but has good ROM.  NV intact.  Muscle tone and strength normal.  Right shoulder has near full ROM but tender in the extremes.  NV intact.  Encounter Diagnoses  Name Primary?   Chronic bilateral low back pain with bilateral sciatica Yes   Acute pain of right shoulder    Chronic pain syndrome    I refilled his pain medicine yesterday.  Return in two months.  Call if any problem.  Precautions discussed.  Electronically Signed Darreld Mclean, MD 11/20/20248:11 AM

## 2023-06-20 ENCOUNTER — Telehealth: Payer: Self-pay | Admitting: Orthopaedic Surgery

## 2023-06-20 MED ORDER — OXYCODONE-ACETAMINOPHEN 5-325 MG PO TABS: ORAL_TABLET | ORAL | 0 refills | Status: DC

## 2023-06-20 NOTE — Telephone Encounter (Signed)
Shawn Pittman  Patient called at 7:29 am left voicemail he needs his pain medicine refill  oxyCODONE-acetaminophen (PERCOCET/ROXICET) 5-325 MG tablet

## 2023-06-21 ENCOUNTER — Encounter: Payer: Self-pay | Admitting: Orthopedic Surgery

## 2023-06-21 ENCOUNTER — Ambulatory Visit: Payer: MEDICAID | Admitting: Orthopedic Surgery

## 2023-06-21 VITALS — Ht 70.0 in | Wt 252.4 lb

## 2023-06-21 DIAGNOSIS — M75111 Incomplete rotator cuff tear or rupture of right shoulder, not specified as traumatic: Secondary | ICD-10-CM | POA: Diagnosis not present

## 2023-06-21 NOTE — Progress Notes (Signed)
New Patient Visit  Assessment: Shawn Pittman is a 57 y.o. male with the following: 1. Incomplete tear of right rotator cuff, unspecified whether traumatic  Plan: [name] has pain in his right shoulder.  No specific injury.  He states it started a few months ago, after an increased workload, while completing in addition.  He does a lot of work overhead.  Prior subacromial steroid injection was not helpful.  MRI demonstrates supraspinatus tendinosis, with partial-thickness tearing.  I do not think there is a role for surgery.  He is not interested in surgery.  He states he has to continue to work.  Medications have not provided sufficient relief.  At this point, I am recommended an ultrasound-guided injection of the glenohumeral joint.  He has elected to proceed.  Ultimately, he may benefit from some time off work.  This was discussed in clinic today.  We will consider this in the future.  He will contact the clinic if he has any further issues.   Procedure note injection - Right shoulder, ultrasound guidance   Verbal consent was obtained to inject the Right shoulder, glenohumeral joint  Timeout was completed to confirm the site of injection.   Using the ultrasound, the rotator cuff tendons were identified.  The joint space was also identified. The skin was prepped with alcohol and ethyl chloride was sprayed at the injection site.  A 21-gauge needle was used to inject 40 mg of Depo-Medrol and 1% lidocaine (4 cc) into the glenohumeral joint space of the Right shoulder using a posterolateral approach.  The needle was visualized entering the glenohumeral joint, and the medication was also visualized. There were no complications.  A sterile bandage was applied.   Note: In order to accurately identify the placement of the needle, ultrasound was required, to increase the accuracy, and specificity of the injection.   Follow-up: Return if symptoms worsen or fail to  improve.  Subjective:  Chief Complaint  Patient presents with   Shoulder Pain    R/ hurts all the time, wakes me up in middle night, hurts to turn arm to pick something up.    History of Present Illness: Shawn Pittman is a 57 y.o. male who presents for evaluation of shoulder pain.  He is right-hand dominant.  He works in [employer].  He does a lot of overhead work.  He has had pain in the shoulder for several months.  It started after doing some additional work, building in addition.  He states he was doing work by himself, and his shoulder started to ache.  Since then, he has continued to have problems.  No specific injury.  No specific fall.  No history of dislocation.  He is on chronic narcotic therapy.  He had a subacromial steroid injection by Dr. Hilda Lias, which she states did not provide any relief of his symptoms.  He tried prednisone, this did not help either.  He is the only person in his house who works.  He is unable to time off.  He did have a week off recently, and states that his shoulder did feel better, but the pain returned when he got back to work.   Review of Systems: No fevers or chills No numbness or tingling No chest pain No shortness of breath No bowel or bladder dysfunction No GI distress No headaches   Medical History:  Past Medical History:  Diagnosis Date   Back pain, chronic    Hyperlipidemia    Hypertension  MRSA (methicillin resistant Staphylococcus aureus)     Past Surgical History:  Procedure Laterality Date   EYE SURGERY  1989   removed R eye    Family History  Problem Relation Age of Onset   Heart attack Mother    Cancer Father    Social History   Tobacco Use   Smoking status: Never    Passive exposure: Past   Smokeless tobacco: Never  Vaping Use   Vaping status: Never Used  Substance Use Topics   Alcohol use: Not Currently    Comment: no ETOH since 2000   Drug use: Not Currently    Types: Marijuana, Cocaine    Comment:  none since 08/2022    No Known Allergies  Current Meds  Medication Sig   ALPRAZolam (XANAX) 0.5 MG tablet Take 0.5 mg by mouth 3 (three) times daily as needed.   atorvastatin (LIPITOR) 20 MG tablet Take 1 tablet by mouth once daily   buPROPion (WELLBUTRIN XL) 150 MG 24 hr tablet Take 150 mg by mouth daily.   buPROPion (WELLBUTRIN XL) 300 MG 24 hr tablet Take 300 mg by mouth daily.   diclofenac (VOLTAREN) 75 MG EC tablet Take 1 tablet (75 mg total) by mouth 2 (two) times daily with a meal.   lisinopril (ZESTRIL) 20 MG tablet Take 2 tablets (40 mg total) by mouth daily.   lisinopril-hydrochlorothiazide (ZESTORETIC) 20-25 MG tablet Take 1 tablet by mouth daily.   oxyCODONE-acetaminophen (PERCOCET/ROXICET) 5-325 MG tablet One tablet every four hours as need for pain   predniSONE (STERAPRED UNI-PAK 21 TAB) 5 MG (21) TBPK tablet Take 6 pills first day; 5 pills second day; 4 pills third day; 3 pills fourth day; 2 pills next day and 1 pill last day.   rosuvastatin (CRESTOR) 5 MG tablet Take 5 mg by mouth daily.    Objective: Ht 5\' 10"  (1.778 m)   Wt 252 lb 6 oz (114.5 kg)   BMI 36.21 kg/m   Physical Exam:  General: Alert and oriented. and No acute distress. Gait: Normal gait.  Shoulder without deformity.  No bruising.  No swelling.  Tenderness palpation over the anterior shoulder.  He has 160 degrees of forward flexion, with some discomfort.  90 degrees of abduction.  Internal rotation to lumbar spine.  45 degrees of external rotation at his side.  5/5 strength in the deltoid.  4/5 strength in the supraspinatus and infraspinatus.  Positive O'Brien's.   IMAGING: I personally reviewed images previously obtained in clinic  Left shoulder MRI  IMPRESSION: 1. Severe supraspinatus tendinosis with a partial-thickness bursal surface tear along the anterior-most aspect and a small partial-thickness articular surface tear. 2. Mild infraspinatus and subscapularis tendinosis.    New  Medications:  No orders of the defined types were placed in this encounter.     Oliver Barre, MD  06/21/2023 9:34 AM

## 2023-06-21 NOTE — Patient Instructions (Signed)

## 2023-06-27 ENCOUNTER — Telehealth: Payer: Self-pay | Admitting: Orthopaedic Surgery

## 2023-06-27 MED ORDER — OXYCODONE-ACETAMINOPHEN 5-325 MG PO TABS: ORAL_TABLET | ORAL | 0 refills | Status: DC

## 2023-06-27 NOTE — Telephone Encounter (Signed)
Dr. Keeling's pt - pt lvm requesting a refill on Oxycodone 5-325 to be sent to [facility] in [place] 

## 2023-07-03 ENCOUNTER — Telehealth: Payer: Self-pay | Admitting: Orthopaedic Surgery

## 2023-07-03 NOTE — Telephone Encounter (Signed)
DR. Dallas Schimke  Patient called 07/02/23 left voicemail stating his shoulder is hurting really bad. He wants to see Dr. Dallas Schimke as soon as possible about his shoulder.  I called the patient left voicemail to call the office.

## 2023-07-04 ENCOUNTER — Telehealth: Payer: Self-pay | Admitting: Orthopaedic Surgery

## 2023-07-04 ENCOUNTER — Encounter: Payer: Self-pay | Admitting: Orthopedic Surgery

## 2023-07-04 ENCOUNTER — Ambulatory Visit (INDEPENDENT_AMBULATORY_CARE_PROVIDER_SITE_OTHER): Payer: MEDICAID | Admitting: Orthopedic Surgery

## 2023-07-04 ENCOUNTER — Telehealth: Payer: Self-pay

## 2023-07-04 VITALS — BP 170/99 | HR 79 | Ht 70.0 in | Wt 251.0 lb

## 2023-07-04 DIAGNOSIS — M75111 Incomplete rotator cuff tear or rupture of right shoulder, not specified as traumatic: Secondary | ICD-10-CM | POA: Diagnosis not present

## 2023-07-04 MED ORDER — PREDNISONE 10 MG (21) PO TBPK: ORAL_TABLET | ORAL | 0 refills | Status: DC

## 2023-07-04 NOTE — Progress Notes (Signed)
Return patient Visit  Assessment: Shawn Pittman is a 57 y.o. male with the following: 1. Incomplete tear of right rotator cuff, unspecified whether traumatic  Plan: [name] has had acute worsening of the pain in the right shoulder.  He states the pain following the injection was initially worse, but was starting to ease when he "reinjured" his right shoulder.  Once again, reviewed the MRI findings in clinic today.  There may be a role for a right shoulder arthroscopy, with partial repair and patch augmentation.  However, he is very reluctant to consider surgery, given the length of time he would need to fully recover.  He states understanding.  Too early to repeat an injection.  Too early to consider surgery given the most recent injection.  Encouraged him to take it easy.  Provided him with prednisone, to help with the acute pain.  This may improve his symptoms overall.  He does have decent range of motion, and very mild weakness.  He will return to clinic to see me as needed.   Follow-up: Return if symptoms worsen or fail to improve.  Subjective:  Chief Complaint  Patient presents with   Shoulder Pain    Pt states he did something to his shoulder 06/30/23. Pt states he felt like someone stabbed him with an icepick and pulled down and that things have gone downhill since then.    History of Present Illness: Shawn Pittman is a 57 y.o. male who returns for evaluation of shoulder pain.  I saw him in clinic a couple of weeks ago.  At that time, we completed an ultrasound-guided injection of the right glenohumeral joint.  He states he had some worsening pain initially, but this was starting to get better.  Unfortunately, just a few days ago, he had worsening of the pain.  He noted shearing sensation, as well as sharp pains.  Since then, the pain has improved a little bit, but he still doing poorly.   Review of Systems: No fevers or chills No numbness or tingling No chest pain No  shortness of breath No bowel or bladder dysfunction No GI distress No headaches      Objective: BP (!) 170/99   Pulse 79   Ht 5\' 10"  (1.778 m)   Wt 251 lb (113.9 kg)   BMI 36.01 kg/m   Physical Exam:  General: Alert and oriented. and No acute distress. Gait: Normal gait.  Shoulder without deformity.  No bruising.  No swelling.  Tenderness palpation over the anterior shoulder.  He has 160 degrees of forward flexion, with some discomfort.  90 degrees of abduction.  Internal rotation to lumbar spine.  45 degrees of external rotation at his side.  5/5 strength in the deltoid.  4/5 strength in the supraspinatus and infraspinatus.  Positive O'Brien's.   IMAGING: No new imaging obtained today     New Medications:  Meds ordered this encounter  Medications   predniSONE (STERAPRED UNI-PAK 21 TAB) 10 MG (21) TBPK tablet    Sig: 10 mg DS 12 as directed    Dispense:  48 tablet    Refill:  0      Oliver Barre, MD  07/04/2023 11:50 AM

## 2023-07-04 NOTE — Telephone Encounter (Signed)
Dr. Keeling's pt - pt lvm requesting a refill on Oxycodone 5-325 to be sent to [facility] in [place] 

## 2023-07-05 MED ORDER — OXYCODONE-ACETAMINOPHEN 5-325 MG PO TABS: ORAL_TABLET | ORAL | 0 refills | Status: DC

## 2023-07-05 NOTE — Telephone Encounter (Signed)
Complete

## 2023-07-11 ENCOUNTER — Ambulatory Visit: Payer: MEDICAID | Admitting: Orthopedic Surgery

## 2023-07-11 ENCOUNTER — Telehealth: Payer: Self-pay | Admitting: Orthopaedic Surgery

## 2023-07-11 NOTE — Telephone Encounter (Signed)
Dr. Sanjuan Dame pt - spoke w/the pt, he is requesting a refill on Oxycodone 5-325 to be sent to [facility] in [place]

## 2023-07-12 MED ORDER — OXYCODONE-ACETAMINOPHEN 5-325 MG PO TABS: ORAL_TABLET | ORAL | 0 refills | Status: DC

## 2023-07-17 ENCOUNTER — Other Ambulatory Visit: Payer: Self-pay

## 2023-07-17 MED ORDER — OXYCODONE-ACETAMINOPHEN 5-325 MG PO TABS: ORAL_TABLET | ORAL | 0 refills | Status: DC

## 2023-07-17 NOTE — Telephone Encounter (Signed)
Oxycodone-Acetaminophen 5/325 MG Qty 25 Tablets  One tablet every four hours as need for pain   PATIENT USES [facility]

## 2023-07-24 ENCOUNTER — Telehealth: Payer: Self-pay

## 2023-07-24 NOTE — Telephone Encounter (Signed)
 Oxycodone-Acetaminophen 5/325 MG Qty 25 Tablets  One tablet every four hours as need for pain   PATIENT USES [facility]

## 2023-07-25 ENCOUNTER — Telehealth: Payer: Self-pay | Admitting: Orthopaedic Surgery

## 2023-07-25 NOTE — Telephone Encounter (Signed)
 Dr. Sanjuan Pittman pt - pt lvm requesting a refill for Oxycodone 5-325, 25 tablets, One tablet every four hours as need for pain to be sent to [facility] in [place]

## 2023-07-27 MED ORDER — OXYCODONE-ACETAMINOPHEN 5-325 MG PO TABS: ORAL_TABLET | ORAL | 0 refills | Status: DC

## 2023-08-01 ENCOUNTER — Telehealth: Payer: Self-pay | Admitting: Orthopaedic Surgery

## 2023-08-01 NOTE — Telephone Encounter (Signed)
Dr. Keeling's pt - pt lvm requesting a refill on Oxycodone 5-325 to be sent to [facility] [place] 

## 2023-08-02 MED ORDER — OXYCODONE-ACETAMINOPHEN 5-325 MG PO TABS: ORAL_TABLET | ORAL | 0 refills | Status: DC

## 2023-08-09 ENCOUNTER — Telehealth: Payer: Self-pay | Admitting: Orthopaedic Surgery

## 2023-08-09 MED ORDER — OXYCODONE-ACETAMINOPHEN 5-325 MG PO TABS: ORAL_TABLET | ORAL | 0 refills | Status: DC

## 2023-08-09 NOTE — Telephone Encounter (Signed)
 Dr. Vicente Graham pt - pt lvm at 6:10am today stating he needs a refill on his Oxycodone  5-325 to be sent to [facility]

## 2023-08-15 ENCOUNTER — Telehealth: Payer: Self-pay | Admitting: Orthopaedic Surgery

## 2023-08-15 NOTE — Telephone Encounter (Signed)
Dr. Sanjuan Dame pt - pt lvm requesting a refill for Oxycodone 5-325 to be sent to [facility] in [place]

## 2023-08-16 ENCOUNTER — Ambulatory Visit: Payer: MEDICAID | Admitting: Orthopaedic Surgery

## 2023-08-16 MED ORDER — OXYCODONE-ACETAMINOPHEN 5-325 MG PO TABS: ORAL_TABLET | ORAL | 0 refills | Status: DC

## 2023-08-23 ENCOUNTER — Telehealth: Payer: Self-pay | Admitting: Orthopaedic Surgery

## 2023-08-23 MED ORDER — OXYCODONE-ACETAMINOPHEN 5-325 MG PO TABS: ORAL_TABLET | ORAL | 0 refills | Status: DC

## 2023-08-23 NOTE — Telephone Encounter (Signed)
Dr. Sanjuan Dame pt - pt lvm requesting a refill for Oxycodone 5-325 to be sent to [facility]

## 2023-08-24 ENCOUNTER — Encounter: Payer: Self-pay | Admitting: Orthopaedic Surgery

## 2023-08-24 ENCOUNTER — Ambulatory Visit: Payer: MEDICAID | Admitting: Orthopaedic Surgery

## 2023-08-24 VITALS — BP 150/101 | HR 87 | Ht 70.0 in | Wt 255.0 lb

## 2023-08-24 DIAGNOSIS — M5441 Lumbago with sciatica, right side: Secondary | ICD-10-CM

## 2023-08-24 DIAGNOSIS — M75111 Incomplete rotator cuff tear or rupture of right shoulder, not specified as traumatic: Secondary | ICD-10-CM

## 2023-08-24 DIAGNOSIS — G894 Chronic pain syndrome: Secondary | ICD-10-CM

## 2023-08-24 DIAGNOSIS — G8929 Other chronic pain: Secondary | ICD-10-CM

## 2023-08-24 DIAGNOSIS — M5442 Lumbago with sciatica, left side: Secondary | ICD-10-CM | POA: Diagnosis not present

## 2023-08-24 MED ORDER — DICLOFENAC SODIUM 75 MG PO TBEC: 75.0000 mg | DELAYED_RELEASE_TABLET | Freq: Two times a day (BID) | ORAL | 3 refills | Status: DC

## 2023-08-24 NOTE — Progress Notes (Signed)
My shoulder is hurting more.  He had a new injury to the right shoulder.  He has seen Dr. Dallas Schimke for this.  His lower back is chronically painful but he has no new trauma there, no paresthesias.  He has no weakness.  Lower back is tender but has good ROM, NV intact, muscle tone and strength normal.    Encounter Diagnoses  Name Primary?   Chronic bilateral low back pain with bilateral sciatica Yes   Chronic pain syndrome    Incomplete tear of right rotator cuff, unspecified whether traumatic    Continue his medicine.    I have refilled his diclofenac.  Return in three months.  Call if any problem.  Precautions discussed.  Electronically Signed Darreld Mclean, MD 1/30/20258:33 AM

## 2023-08-29 ENCOUNTER — Telehealth: Payer: Self-pay | Admitting: Orthopaedic Surgery

## 2023-08-29 MED ORDER — OXYCODONE-ACETAMINOPHEN 5-325 MG PO TABS: ORAL_TABLET | ORAL | 0 refills | Status: DC

## 2023-08-29 NOTE — Telephone Encounter (Signed)
Dr. Sanjuan Dame pt - pt lvm requesting a refill on his Oxycodone, but stated to remind Dr. Hilda Lias that he was going to increase the dosage.  He uses [facility].

## 2023-08-30 ENCOUNTER — Telehealth: Payer: Self-pay | Admitting: Orthopaedic Surgery

## 2023-08-30 NOTE — Telephone Encounter (Signed)
 Dr. Vicente Graham pt - spoke w/the pt, he stated that there is a problem w/the mediation sent in, he stated the dosage was supposed to be increased and it wasn't.  [phone]

## 2023-09-06 ENCOUNTER — Telehealth: Payer: Self-pay | Admitting: Orthopedic Surgery

## 2023-09-06 ENCOUNTER — Telehealth: Payer: Self-pay | Admitting: Orthopaedic Surgery

## 2023-09-06 MED ORDER — OXYCODONE-ACETAMINOPHEN 5-325 MG PO TABS: ORAL_TABLET | ORAL | 0 refills | Status: DC

## 2023-09-06 MED ORDER — PREDNISONE 10 MG (21) PO TBPK: ORAL_TABLET | ORAL | 0 refills | Status: DC

## 2023-09-06 NOTE — Telephone Encounter (Signed)
Dr. Dallas Schimke pt for shoulder per the pt - spoke w/him, he stated that he's trying different things for his shoulder.  He stated that he bought some medical tape/athletic tape and the pharmacy told him to call us to tell/show him how to use it.  He stated he needs instructions or a diagram on how to use it.  He stated that if he needs to ride over here and let someone help him he will.

## 2023-09-06 NOTE — Telephone Encounter (Signed)
Dr. Sanjuan Dame pt - pt lvm early this morning requesting a refill on Oxycodone 5-325 to be sent to [facility].  He stated to remind Dr. Hilda Lias to up the dosage.

## 2023-09-07 NOTE — Telephone Encounter (Signed)
Called and lvm advising what Dr. Dallas Schimke suggested.

## 2023-09-12 ENCOUNTER — Telehealth: Payer: Self-pay | Admitting: Orthopaedic Surgery

## 2023-09-12 MED ORDER — OXYCODONE-ACETAMINOPHEN 5-325 MG PO TABS: ORAL_TABLET | ORAL | 0 refills | Status: DC

## 2023-09-12 NOTE — Telephone Encounter (Signed)
Dr. Keeling's pt - pt lvm requesting a refill on Oxycodone 5-325 to be sent to [facility] in [place] 

## 2023-09-20 ENCOUNTER — Telehealth: Payer: Self-pay | Admitting: Orthopaedic Surgery

## 2023-09-20 NOTE — Telephone Encounter (Signed)
 Dr. Sanjuan Dame pt - spoke w/the patient, he is requesting a refill for Oxycodone 5-325 to be sent to [facility] in [place].

## 2023-09-21 MED ORDER — OXYCODONE-ACETAMINOPHEN 5-325 MG PO TABS: ORAL_TABLET | ORAL | 0 refills | Status: DC

## 2023-09-26 ENCOUNTER — Telehealth: Payer: Self-pay | Admitting: Orthopaedic Surgery

## 2023-09-26 NOTE — Telephone Encounter (Signed)
 Dr. Sanjuan Dame pt - spoke w/the pt, he is requesting a refill for Oxycodone 5-325 to be sent to [facility] in [place].

## 2023-09-27 MED ORDER — OXYCODONE-ACETAMINOPHEN 5-325 MG PO TABS: ORAL_TABLET | ORAL | 0 refills | Status: DC

## 2023-10-04 ENCOUNTER — Telehealth: Payer: Self-pay | Admitting: Orthopaedic Surgery

## 2023-10-04 NOTE — Telephone Encounter (Signed)
 Dr. Sanjuan Dame pt - pt lvm requesting a refill for Oxycodone 5-325 to be sent to [facility] in [place]

## 2023-10-05 MED ORDER — OXYCODONE-ACETAMINOPHEN 5-325 MG PO TABS: ORAL_TABLET | ORAL | 0 refills | Status: DC

## 2023-10-10 ENCOUNTER — Telehealth: Payer: Self-pay

## 2023-10-10 MED ORDER — OXYCODONE-ACETAMINOPHEN 5-325 MG PO TABS: ORAL_TABLET | ORAL | 0 refills | Status: DC

## 2023-10-10 NOTE — Telephone Encounter (Signed)
 Oxycodone- Acetaminophen  5-325 MG Qty 30 Tablets  PATIENT USES [facility] IN [place]

## 2023-10-11 ENCOUNTER — Ambulatory Visit: Payer: MEDICAID | Attending: Cardiology | Admitting: Cardiology

## 2023-10-11 ENCOUNTER — Encounter: Payer: Self-pay | Admitting: Cardiology

## 2023-10-11 VITALS — BP 110/70 | HR 76 | Ht 70.0 in | Wt 269.2 lb

## 2023-10-11 DIAGNOSIS — E782 Mixed hyperlipidemia: Secondary | ICD-10-CM | POA: Diagnosis not present

## 2023-10-11 DIAGNOSIS — I10 Essential (primary) hypertension: Secondary | ICD-10-CM

## 2023-10-11 DIAGNOSIS — I712 Thoracic aortic aneurysm, without rupture, unspecified: Secondary | ICD-10-CM

## 2023-10-11 NOTE — Progress Notes (Signed)
      Clinical Summary Shawn Pittman is a 58 y.o.male seen today as a new consult, referred by Dr Leandro Reasoner for the following medical problems.   1.Aortic aneurysm - 06/2019 CTA: 4.2 cm ascending thoracic aortic aneurysm - he thinks at one point he was told it was up to 5 cm but not 100% percent sure  2. HTN - compliant with meds - well controlled today but reports typically runs higher at home   3. HLD - labs followed by pcp   Past Medical History:  Diagnosis Date   Back pain, chronic    Hyperlipidemia    Hypertension    MRSA (methicillin resistant Staphylococcus aureus)      No Known Allergies   Current Outpatient Medications  Medication Sig Dispense Refill   ALPRAZolam (XANAX) 0.5 MG tablet Take 0.5 mg by mouth 3 (three) times daily as needed.     atorvastatin (LIPITOR) 20 MG tablet Take 1 tablet by mouth once daily 30 tablet 0   diclofenac (VOLTAREN) 75 MG EC tablet Take 1 tablet (75 mg total) by mouth 2 (two) times daily with a meal. 60 tablet 3   lisinopril (ZESTRIL) 20 MG tablet Take 2 tablets (40 mg total) by mouth daily. 60 tablet 4   lisinopril-hydrochlorothiazide (ZESTORETIC) 20-25 MG tablet Take 1 tablet by mouth daily.     oxyCODONE-acetaminophen (PERCOCET/ROXICET) 5-325 MG tablet One tablet every four hours as need for pain 30 tablet 0   predniSONE (STERAPRED UNI-PAK 21 TAB) 10 MG (21) TBPK tablet 10 mg DS 12 as directed 48 tablet 0   rosuvastatin (CRESTOR) 5 MG tablet Take 5 mg by mouth daily.     No current facility-administered medications for this visit.     Past Surgical History:  Procedure Laterality Date   EYE SURGERY  1989   removed R eye     No Known Allergies    Family History  Problem Relation Age of Onset   Heart attack Mother    Cancer Father      Social History Mr. Robison reports that he has never smoked. He has been exposed to tobacco smoke. He has never used smokeless tobacco. Mr. Beegle reports that he does not currently use  alcohol.    Physical Examination Today's Vitals   10/11/23 1057  BP: 110/70  Pulse: 76  SpO2: 98%  Weight: 269 lb 3.2 oz (122.1 kg)  Height: 5\' 10"  (1.778 m)   Body mass index is 38.63 kg/m.  Gen: resting comfortably, no acute distress HEENT: no scleral icterus, pupils equal round and reactive, no palptable cervical adenopathy,  CV: RRR, no mrg, no jvd Resp: Clear to auscultation bilaterally GI: abdomen is soft, non-tender, non-distended, normal bowel sounds, no hepatosplenomegaly MSK: extremities are warm, no edema.  Skin: warm, no rash Neuro:  no focal deficits Psych: appropriate affect     Assessment and Plan  1.Aortic aneurysm - 4.2 cm by last CT I am able to see in 2020, needs repeat imaging - will order CTA  2. HTN - well controled here, he reports can run higher at home. Discussed appropriate steps to check bp at home, he will submit bp log 1 week  3. HLD - request labs from pcp  EKG shows SR, no ischemic events  F/u pending CT and bp results         Antoine Poche, M.D.

## 2023-10-11 NOTE — Patient Instructions (Addendum)
 Medication Instructions:  Your physician recommends that you continue on your current medications as directed. Please refer to the Current Medication list given to you today.  *If you need a refill on your cardiac medications before your next appointment, please call your pharmacy*   Lab Work: CBC  BMET  If you have labs (blood work) drawn today and your tests are completely normal, you will receive your results only by: MyChart Message (if you have MyChart) OR A paper copy in the mail If you have any lab test that is abnormal or we need to change your treatment, we will call you to review the results.   Testing/Procedures: CTA- Chest    Follow-Up: At [facility], you and your health needs are our priority.  As part of our continuing mission to provide you with exceptional heart care, we have created designated Provider Care Teams.  These Care Teams include your primary Cardiologist (physician) and Advanced Practice Providers (APPs -  Physician Assistants and Nurse Practitioners) who all work together to provide you with the care you need, when you need it.  We recommend signing up for the patient portal called "MyChart".  Sign up information is provided on this After Visit Summary.  MyChart is used to connect with patients for Virtual Visits (Telemedicine).  Patients are able to view lab/test results, encounter notes, upcoming appointments, etc.  Non-urgent messages can be sent to your provider as well.   To learn more about what you can do with MyChart, go to [email or web address].    Your next appointment:   Follow up is pending testing results    Provider:   You may see Dina Rich, MD or the following Advanced Practice Provider on your designated Care Team:   Sharlene Dory, NP    Other Instructions

## 2023-10-12 ENCOUNTER — Other Ambulatory Visit (HOSPITAL_COMMUNITY)
Admission: RE | Admit: 2023-10-12 | Discharge: 2023-10-12 | Disposition: A | Discharge status: Home / Self Care | Payer: MEDICAID | Source: Ambulatory Visit | Attending: Cardiology | Admitting: Cardiology

## 2023-10-12 ENCOUNTER — Ambulatory Visit (HOSPITAL_COMMUNITY)
Admission: RE | Admit: 2023-10-12 | Discharge: 2023-10-12 | Disposition: A | Discharge status: Home / Self Care | Payer: MEDICAID | Source: Ambulatory Visit | Attending: Cardiology | Admitting: Cardiology

## 2023-10-12 DIAGNOSIS — I712 Thoracic aortic aneurysm, without rupture, unspecified: Secondary | ICD-10-CM | POA: Insufficient documentation

## 2023-10-12 LAB — BASIC METABOLIC PANEL
Anion gap: 11 (ref 5–15)
BUN: 18 mg/dL (ref 6–20)
CO2: 26 mmol/L (ref 22–32)
Calcium: 9.1 mg/dL (ref 8.9–10.3)
Chloride: 98 mmol/L (ref 98–111)
Creatinine, Ser: 0.96 mg/dL (ref 0.61–1.24)
GFR, Estimated: >60 mL/min (ref >60)
Glucose, Bld: 156 mg/dL — ABNORMAL HIGH (ref 70–99)
Potassium: 3.7 mmol/L (ref 3.5–5.1)
Sodium: 135 mmol/L (ref 135–145)

## 2023-10-12 MED ORDER — IOHEXOL 350 MG/ML SOLN
75.0000 mL | Freq: Once | INTRAVENOUS | Status: AC | PRN
  Administered 2023-10-12: 75 mL via INTRAVENOUS

## 2023-10-18 ENCOUNTER — Telehealth: Payer: Self-pay | Admitting: Orthopaedic Surgery

## 2023-10-18 MED ORDER — OXYCODONE-ACETAMINOPHEN 5-325 MG PO TABS
ORAL_TABLET | ORAL | 0 refills | Status: DC
Start: 2023-10-18 — End: 2023-10-24

## 2023-10-18 NOTE — Telephone Encounter (Signed)
 Dr. Sanjuan Dame pt - pt lvm requesting a refill for Oxycodone 5-325 to be sent to [facility]

## 2023-10-24 ENCOUNTER — Other Ambulatory Visit: Payer: Self-pay | Admitting: Orthopaedic Surgery

## 2023-10-24 MED ORDER — OXYCODONE-ACETAMINOPHEN 5-325 MG PO TABS: ORAL_TABLET | ORAL | 0 refills | Status: DC

## 2023-10-24 NOTE — Telephone Encounter (Signed)
 DR. Hilda Lias   Patient called in for his pain medicine   oxyCODONE-acetaminophen (PERCOCET/ROXICET) 5-325 MG tablet    Pharmacy [facility] in [place]

## 2023-10-31 ENCOUNTER — Telehealth: Payer: Self-pay | Admitting: Orthopaedic Surgery

## 2023-10-31 MED ORDER — OXYCODONE-ACETAMINOPHEN 5-325 MG PO TABS
ORAL_TABLET | ORAL | 0 refills | Status: DC
Start: 2023-10-31 — End: 2023-11-07

## 2023-10-31 NOTE — Telephone Encounter (Signed)
 Dr. Sanjuan Dame pt - spoke w/the pt, he is requesting a refill for Oxycodone 5-325 to be sent to [facility] in [place].

## 2023-11-02 ENCOUNTER — Encounter: Payer: Self-pay | Admitting: *Deleted

## 2023-11-07 ENCOUNTER — Telehealth: Payer: Self-pay

## 2023-11-07 MED ORDER — OXYCODONE-ACETAMINOPHEN 5-325 MG PO TABS: ORAL_TABLET | ORAL | 0 refills | Status: DC

## 2023-11-07 NOTE — Telephone Encounter (Signed)
 Oxycodone-Acetaminophen 5/325 MG   Qty 30 Tablets  PATIENT USES [facility] IN [place]

## 2023-11-14 ENCOUNTER — Telehealth: Payer: Self-pay | Admitting: Orthopaedic Surgery

## 2023-11-14 MED ORDER — OXYCODONE-ACETAMINOPHEN 5-325 MG PO TABS
ORAL_TABLET | ORAL | 0 refills | Status: DC
Start: 2023-11-14 — End: 2023-11-21

## 2023-11-14 NOTE — Telephone Encounter (Signed)
 Dr. Sanjuan Dame pt - pt lvm requesting a refill for Oxycodone 5-325 to be sent to [facility] in [place]

## 2023-11-14 NOTE — Addendum Note (Signed)
 Addended by: Milinda Allen on: 11/14/2023 07:08 PM   Modules accepted: Orders

## 2023-11-16 ENCOUNTER — Telehealth: Payer: Self-pay | Admitting: Cardiology

## 2023-11-16 DIAGNOSIS — R29818 Other symptoms and signs involving the nervous system: Secondary | ICD-10-CM

## 2023-11-16 NOTE — Telephone Encounter (Signed)
 Pt came into [facility] office asking about his CTA Angio results. He has not heard from anyone since he has had it done. He also said his PCP told him to see he we can set up a sleep study for him.   He can only give me the first name of the PCP: Alcario Alvine with [facility]

## 2023-11-16 NOTE — Telephone Encounter (Signed)
 CT shows aortic aneurysm is moderate at 4.5 cm, slighly increased from prior CT.  We will continue to monitor, f/u with me 6 months.    Letta Raw MD

## 2023-11-17 ENCOUNTER — Encounter (HOSPITAL_COMMUNITY): Payer: Self-pay | Admitting: *Deleted

## 2023-11-17 ENCOUNTER — Other Ambulatory Visit: Payer: Self-pay

## 2023-11-17 ENCOUNTER — Emergency Department (HOSPITAL_COMMUNITY)
Admission: EM | Admit: 2023-11-17 | Discharge: 2023-11-17 | Disposition: A | Discharge status: Home / Self Care | Payer: MEDICAID | Attending: Emergency Medicine | Admitting: Emergency Medicine

## 2023-11-17 ENCOUNTER — Emergency Department (HOSPITAL_COMMUNITY): Payer: MEDICAID

## 2023-11-17 DIAGNOSIS — W19XXXA Unspecified fall, initial encounter: Secondary | ICD-10-CM

## 2023-11-17 DIAGNOSIS — R519 Headache, unspecified: Secondary | ICD-10-CM | POA: Diagnosis not present

## 2023-11-17 DIAGNOSIS — R1013 Epigastric pain: Secondary | ICD-10-CM | POA: Diagnosis not present

## 2023-11-17 DIAGNOSIS — R0789 Other chest pain: Secondary | ICD-10-CM | POA: Diagnosis not present

## 2023-11-17 DIAGNOSIS — G8929 Other chronic pain: Secondary | ICD-10-CM

## 2023-11-17 DIAGNOSIS — S20219A Contusion of unspecified front wall of thorax, initial encounter: Secondary | ICD-10-CM

## 2023-11-17 DIAGNOSIS — R109 Unspecified abdominal pain: Secondary | ICD-10-CM | POA: Diagnosis present

## 2023-11-17 DIAGNOSIS — S0990XA Unspecified injury of head, initial encounter: Secondary | ICD-10-CM

## 2023-11-17 LAB — TROPONIN I (HIGH SENSITIVITY)
Troponin I (High Sensitivity): 5 ng/L (ref <18)
Troponin I (High Sensitivity): 5 ng/L (ref <18)

## 2023-11-17 LAB — URINALYSIS, ROUTINE W REFLEX MICROSCOPIC
Bilirubin Urine: NEGATIVE
Glucose, UA: NEGATIVE mg/dL
Hgb urine dipstick: NEGATIVE
Ketones, ur: NEGATIVE mg/dL
Leukocytes,Ua: NEGATIVE
Nitrite: NEGATIVE
Protein, ur: NEGATIVE mg/dL
Specific Gravity, Urine: 1.01 (ref 1.005–1.030)
pH: 5 (ref 5.0–8.0)

## 2023-11-17 LAB — CBC WITH DIFFERENTIAL/PLATELET
Abs Immature Granulocytes: 0.02 10*3/uL (ref 0.00–0.07)
Basophils Absolute: 0 10*3/uL (ref 0.0–0.1)
Basophils Relative: 1 %
Eosinophils Absolute: 0.2 10*3/uL (ref 0.0–0.5)
Eosinophils Relative: 3 %
HCT: 33.4 % — ABNORMAL LOW (ref 39.0–52.0)
Hemoglobin: 12 g/dL — ABNORMAL LOW (ref 13.0–17.0)
Immature Granulocytes: 0 %
Lymphocytes Relative: 29 %
Lymphs Abs: 1.5 10*3/uL (ref 0.7–4.0)
MCH: 31 pg (ref 26.0–34.0)
MCHC: 35.9 g/dL (ref 30.0–36.0)
MCV: 86.3 fL (ref 80.0–100.0)
Monocytes Absolute: 0.6 10*3/uL (ref 0.1–1.0)
Monocytes Relative: 11 %
Neutro Abs: 3 10*3/uL (ref 1.7–7.7)
Neutrophils Relative %: 56 %
Platelets: 167 10*3/uL (ref 150–400)
RBC: 3.87 MIL/uL — ABNORMAL LOW (ref 4.22–5.81)
RDW: 12.6 % (ref 11.5–15.5)
WBC: 5.3 10*3/uL (ref 4.0–10.5)
nRBC: 0 % (ref 0.0–0.2)

## 2023-11-17 LAB — COMPREHENSIVE METABOLIC PANEL WITH GFR
ALT: 18 U/L (ref 0–44)
AST: 18 U/L (ref 15–41)
Albumin: 3.6 g/dL (ref 3.5–5.0)
Alkaline Phosphatase: 40 U/L (ref 38–126)
Anion gap: 9 (ref 5–15)
BUN: 14 mg/dL (ref 6–20)
CO2: 26 mmol/L (ref 22–32)
Calcium: 8.9 mg/dL (ref 8.9–10.3)
Chloride: 103 mmol/L (ref 98–111)
Creatinine, Ser: 0.84 mg/dL (ref 0.61–1.24)
GFR, Estimated: >60 mL/min (ref >60)
Glucose, Bld: 121 mg/dL — ABNORMAL HIGH (ref 70–99)
Potassium: 3.5 mmol/L (ref 3.5–5.1)
Sodium: 138 mmol/L (ref 135–145)
Total Bilirubin: 0.4 mg/dL (ref 0.0–1.2)
Total Protein: 6.2 g/dL — ABNORMAL LOW (ref 6.5–8.1)

## 2023-11-17 LAB — MAGNESIUM: Magnesium: 2 mg/dL (ref 1.7–2.4)

## 2023-11-17 MED ORDER — METHOCARBAMOL 500 MG PO TABS
500.0000 mg | ORAL_TABLET | Freq: Two times a day (BID) | ORAL | 0 refills | Status: AC
Start: 2023-11-17 — End: ?

## 2023-11-17 MED ORDER — MORPHINE SULFATE (PF) 4 MG/ML IV SOLN
4.0000 mg | Freq: Once | INTRAVENOUS | Status: AC
  Administered 2023-11-17: 4 mg via INTRAVENOUS
  Filled 2023-11-17: qty 1

## 2023-11-17 MED ORDER — HYDROMORPHONE HCL 1 MG/ML IJ SOLN
1.0000 mg | Freq: Once | INTRAMUSCULAR | Status: AC
  Administered 2023-11-17: 1 mg via INTRAVENOUS
  Filled 2023-11-17: qty 1

## 2023-11-17 MED ORDER — IOHEXOL 300 MG/ML  SOLN
100.0000 mL | Freq: Once | INTRAMUSCULAR | Status: AC | PRN
  Administered 2023-11-17: 100 mL via INTRAVENOUS

## 2023-11-17 MED ORDER — SODIUM CHLORIDE 0.9 % IV BOLUS
500.0000 mL | Freq: Once | INTRAVENOUS | Status: AC
  Administered 2023-11-17: 500 mL via INTRAVENOUS

## 2023-11-17 MED ORDER — ONDANSETRON HCL 4 MG/2ML IJ SOLN
4.0000 mg | Freq: Once | INTRAMUSCULAR | Status: AC
  Administered 2023-11-17: 4 mg via INTRAVENOUS
  Filled 2023-11-17: qty 2

## 2023-11-17 MED ORDER — KETOROLAC TROMETHAMINE 15 MG/ML IJ SOLN
15.0000 mg | Freq: Once | INTRAMUSCULAR | Status: AC
  Administered 2023-11-17: 15 mg via INTRAVENOUS
  Filled 2023-11-17: qty 1

## 2023-11-17 NOTE — Telephone Encounter (Signed)
 Patient informed and verbalized understanding of plan. Patient is curious if we can refer him to pulmonology to have a sleep study done per pcp

## 2023-11-17 NOTE — ED Triage Notes (Addendum)
 Pt c/o of several recent falls. Pt states he fell off several ladders, also once in the bathroom for unknown reasons. Reports several breaks and soft tissue injuries. Denies LOC.

## 2023-11-17 NOTE — Discharge Instructions (Signed)
 Please follow-up closely with your primary care doctor on an outpatient basis.  Return to emergency department immediately for any new or worsening symptoms.

## 2023-11-17 NOTE — ED Notes (Signed)
 Pt returned from CT

## 2023-11-17 NOTE — ED Provider Notes (Signed)
 [facility] AT [facility] Provider Note   CSN: [id number] Arrival date & time: 11/17/23  4098     History  Chief Complaint  Patient presents with   Back Pain   Fall    Fell off ladder, several times. Fell in bathroom.    Shawn Pittman is a 58 y.o. male.  Patient is a 58 year old male who presents to the emergency department with a chief complaint of multiple falls over the past week.  Patient notes he does have a history of recurrent falls but notes that he has had 3 falls over the past week noting that one of them was off of a 6 foot ladder.  Patient presents with a complaint of pain to his bilateral ribs, abdomen and flank, headache.  Wife does note that the patient has seemed slower than normal as well.  He notes that he is already on 5 mg of oxycodone  daily by his orthopedic provider but notes that this is not helping. Does note that he has been having some intermittent numbness to his left hand.  He denies any associated dizziness, lightheadedness or syncopal events.  He has been experiencing increased dyspnea as well.   Back Pain Associated symptoms: abdominal pain and chest pain   Fall Associated symptoms include chest pain, abdominal pain and shortness of breath.       Home Medications Prior to Admission medications   Medication Sig Start Date End Date Taking? Authorizing Provider  ALPRAZolam (XANAX) 0.5 MG tablet Take 0.5 mg by mouth 3 (three) times daily as needed. 09/05/22   [provider]  atorvastatin  (LIPITOR) 20 MG tablet Take 1 tablet by mouth once daily 02/03/20   Luane Rumps, PA-C  diclofenac  (VOLTAREN ) 75 MG EC tablet Take 1 tablet (75 mg total) by mouth 2 (two) times daily with a meal. 08/24/23   Pleasant Brilliant, MD  lisinopril  (ZESTRIL ) 20 MG tablet Take 2 tablets (40 mg total) by mouth daily. 08/05/19   Luane Rumps, PA-C  lisinopril -hydrochlorothiazide (ZESTORETIC) 20-25 MG tablet Take 1 tablet by mouth  daily. Patient not taking: Reported on 10/11/2023 02/23/23   [provider]  oxyCODONE -acetaminophen  (PERCOCET/ROXICET) 5-325 MG tablet One tablet every four hours as need for pain 11/14/23   Pleasant Brilliant, MD  predniSONE  (STERAPRED UNI-PAK 21 TAB) 10 MG (21) TBPK tablet 10 mg DS 12 as directed Patient not taking: Reported on 10/11/2023 09/06/23   Pleasant Brilliant, MD  rosuvastatin (CRESTOR) 5 MG tablet Take 5 mg by mouth daily. 03/04/23   [provider]  sertraline (ZOLOFT) 100 MG tablet Take 100 mg by mouth daily.    [provider]      Allergies    Patient has no known allergies.    Review of Systems   Review of Systems  Respiratory:  Positive for shortness of breath.   Cardiovascular:  Positive for chest pain.  Gastrointestinal:  Positive for abdominal pain.  Musculoskeletal:  Positive for back pain.  All other systems reviewed and are negative.   Physical Exam Updated Vital Signs BP 132/76   Pulse 70   Temp (!) 97.5 F (36.4 C) (Oral)   Resp 13   Ht 5\' 10"  (1.778 m)   Wt 122.5 kg   SpO2 97%   BMI 38.74 kg/m  Physical Exam Vitals and nursing note reviewed.  Constitutional:      Appearance: Normal appearance.  HENT:     Head: Normocephalic and atraumatic.     Nose:  Nose normal.     Mouth/Throat:     Mouth: Mucous membranes are moist.  Eyes:     Extraocular Movements: Extraocular movements intact.     Conjunctiva/sclera: Conjunctivae normal.     Pupils: Pupils are equal, round, and reactive to light.  Cardiovascular:     Rate and Rhythm: Normal rate and regular rhythm.     Pulses: Normal pulses.     Heart sounds: Normal heart sounds. No murmur heard.    No gallop.  Pulmonary:     Effort: Pulmonary effort is normal. No respiratory distress.     Breath sounds: Normal breath sounds. No stridor. No wheezing, rhonchi or rales.  Chest:     Chest wall: Tenderness present.  Abdominal:     General: Abdomen is flat. Bowel sounds are normal. There  is no distension.     Palpations: Abdomen is soft.     Tenderness: There is abdominal tenderness. There is no guarding.     Comments: Tender to palpation over epigastric region and left flank, no bruising  Musculoskeletal:        General: No swelling, tenderness, deformity or signs of injury. Normal range of motion.     Cervical back: Normal range of motion and neck supple. No rigidity or tenderness.     Right lower leg: No edema.     Left lower leg: No edema.  Lymphadenopathy:     Cervical: No cervical adenopathy.  Skin:    General: Skin is warm and dry.     Coloration: Skin is not jaundiced.     Findings: No bruising or rash.  Neurological:     General: No focal deficit present.     Mental Status: He is alert and oriented to person, place, and time. Mental status is at baseline.     Cranial Nerves: No cranial nerve deficit.     Sensory: No sensory deficit.     Motor: No weakness.     Coordination: Coordination normal.     Gait: Gait normal.  Psychiatric:        Mood and Affect: Mood normal.        Behavior: Behavior normal.        Thought Content: Thought content normal.        Judgment: Judgment normal.     ED Results / Procedures / Treatments   Labs (all labs ordered are listed, but only abnormal results are displayed) Labs Reviewed  CBC WITH DIFFERENTIAL/PLATELET - Abnormal; Notable for the following components:      Result Value   RBC 3.87 (*)    Hemoglobin 12.0 (*)    HCT 33.4 (*)    All other components within normal limits  COMPREHENSIVE METABOLIC PANEL WITH GFR  URINALYSIS, ROUTINE W REFLEX MICROSCOPIC  MAGNESIUM  TROPONIN I (HIGH SENSITIVITY)    EKG None  Radiology No results found.  Procedures Procedures    Medications Ordered in ED Medications  sodium chloride  0.9 % bolus 500 mL (500 mLs Intravenous New Bag/Given 11/17/23 0955)  morphine  (PF) 4 MG/ML injection 4 mg (4 mg Intravenous Given 11/17/23 0956)  ondansetron  (ZOFRAN ) injection 4 mg (4 mg  Intravenous Given 11/17/23 0956)    ED Course/ Medical Decision Making/ A&P Clinical Course as of 11/17/23 1306  Fri Nov 17, 2023  1032 EKG interpretation: Normal sinus rhythm, rate of 68, normal PR/QRS interval, normal QTc, no ST/T wave changes, no ischemic changes, no STEMI, normal axis [CR]    Clinical Course User Index [  CR] Roselynn Connors, PA-C                                 Medical Decision Making Amount and/or Complexity of Data Reviewed Labs: ordered. Radiology: ordered.  Risk Prescription drug management.   This patient presents to the ED for concern of headache, abdomen and flank pain, bilateral rib pain differential diagnosis includes rib fracture, pneumothorax, hemothorax, ACS, intra-abdominal hemorrhage, vertebral fracture, intracranial hemorrhage, concussion, medication reaction    Additional history obtained:  Additional history obtained from spouse External records from outside source obtained and reviewed including medical records   Lab Tests:  I Ordered, and personally interpreted labs.  The pertinent results include: No leukocytosis, mild anemia, normal electrolytes, normal kidney function liver function, unremarkable urinalysis, normal magnesium, negative serial troponins   Imaging Studies ordered:  I ordered imaging studies including CT scan of head, cervical spine, chest abdomen and pelvis I independently visualized and interpreted imaging which showed no acute intracranial hemorrhage, no vertebral fracture, no acute traumatic injury of the chest, abdomen and pelvis, stable aortic aneurysm I agree with the radiologist interpretation   Medicines ordered and prescription drug management:  I ordered medication including morphine , Dilaudid , Toradol , IV fluids for acute pain Reevaluation of the patient after these medicines showed that the patient improved I have reviewed the patients home medicines and have made adjustments as  needed   Problem List / ED Course:  Patient is doing better at this time and is stable for discharge home.  All workup in the emergency department has been unremarkable with no signs of any acute traumatic injury.  Blood work has been unremarkable as well.  EKG had no acute ischemic changes and patient had negative serial troponins.  Suspect he is suffering from underlying concussion at this point.  He was directed follow-up closely with his primary care doctor on an outpatient basis.  Strict return precautions were discussed for any new or worsening symptoms.  Patient voiced understanding to the plan and had no additional questions.  Patient was fully evaluated by attending physician who is in agreement to plan at this time.   Social Determinants of Health:  None           Final Clinical Impression(s) / ED Diagnoses Final diagnoses:  None    Rx / DC Orders ED Discharge Orders     None         Emmalene Hare 11/17/23 1308    Ninetta Basket, MD 11/19/23 [phone]

## 2023-11-20 NOTE — Telephone Encounter (Signed)
 Left patient detailed voicemail regarding this. Referral sent

## 2023-11-21 ENCOUNTER — Telehealth: Payer: Self-pay | Admitting: Orthopaedic Surgery

## 2023-11-21 MED ORDER — OXYCODONE-ACETAMINOPHEN 5-325 MG PO TABS: ORAL_TABLET | ORAL | 0 refills | Status: DC

## 2023-11-21 NOTE — Telephone Encounter (Signed)
 Dr. Sanjuan Dame pt - pt lvm requesting a refill for Oxycodone 5-325 to be sent to [facility] in [place]

## 2023-11-22 ENCOUNTER — Other Ambulatory Visit (INDEPENDENT_AMBULATORY_CARE_PROVIDER_SITE_OTHER): Payer: MEDICAID

## 2023-11-22 ENCOUNTER — Encounter: Payer: Self-pay | Admitting: Orthopaedic Surgery

## 2023-11-22 ENCOUNTER — Ambulatory Visit (INDEPENDENT_AMBULATORY_CARE_PROVIDER_SITE_OTHER): Payer: MEDICAID | Admitting: Orthopaedic Surgery

## 2023-11-22 VITALS — BP 168/111 | HR 71 | Ht 70.0 in | Wt 266.0 lb

## 2023-11-22 DIAGNOSIS — G8929 Other chronic pain: Secondary | ICD-10-CM

## 2023-11-22 DIAGNOSIS — M5442 Lumbago with sciatica, left side: Secondary | ICD-10-CM

## 2023-11-22 DIAGNOSIS — M5441 Lumbago with sciatica, right side: Secondary | ICD-10-CM | POA: Diagnosis not present

## 2023-11-22 NOTE — Progress Notes (Signed)
 I have fallen several times.  He went to the ER on 11-17-23.  He has fallen several times, one time off a six foot ladder.  He has had pain in the right shoulder and right hand.  He was evaluated and had CT of the cervical spine, negative.  He is still having some right hand pain.  Also, his back is worse.  He has paresthesias to the right leg again.  He has long history of lower back pain and these falls have worsened it.  He is on pain medicine but it is not helping.  He has decreased ROM of the right shoulder, no effusion, NV intact.  ROM of neck is full but painful.  Grips are good but right about same as left.  Right hand dominant.  Lower back is painful. ROM is decreased. Gait is good.  NV intact.  No weakness, muscle tone and strength normal.  X-rays were done of the lumbar spine, reported separately.  I have independently reviewed and interpreted x-rays of this patient done at another site by another physician or qualified health professional.  I have reviewed the ER notes.  Encounter Diagnosis  Name Primary?   Chronic bilateral low back pain with bilateral sciatica Yes   I will get MRI of the lumbar spine.  He has hammer toe right and I will refer to podiatry.  I renewed his pain medicine yesterday.  Return in two weeks.  Call if any problem.  Precautions discussed.  Electronically Signed Pleasant Brilliant, MD 4/30/20259:01 AM

## 2023-11-22 NOTE — Patient Instructions (Signed)
 While we are working on your approval for MRI please go ahead and call to schedule your appointment with [facility] within at least one (1) week.   Central Scheduling [phone]

## 2023-11-24 ENCOUNTER — Telehealth: Payer: Self-pay | Admitting: Orthopaedic Surgery

## 2023-11-24 NOTE — Telephone Encounter (Signed)
 Dr. Vicente Graham pt - pt lvm on 5/02 at 4:09pm stating he was returning someone's call.  I do not see a note that a call was made.  [phone]

## 2023-11-28 ENCOUNTER — Other Ambulatory Visit: Payer: Self-pay | Admitting: *Deleted

## 2023-11-28 ENCOUNTER — Telehealth: Payer: Self-pay | Admitting: Orthopaedic Surgery

## 2023-11-28 DIAGNOSIS — M2041 Other hammer toe(s) (acquired), right foot: Secondary | ICD-10-CM

## 2023-11-28 MED ORDER — OXYCODONE-ACETAMINOPHEN 5-325 MG PO TABS: ORAL_TABLET | ORAL | 0 refills | Status: DC

## 2023-11-28 NOTE — Telephone Encounter (Signed)
 Dr. Vicente Graham pt - pt lvm requesting a refill for Oxycodone  5-325 to be sent to [facility]

## 2023-11-29 ENCOUNTER — Telehealth: Payer: Self-pay | Admitting: Orthopaedic Surgery

## 2023-11-29 ENCOUNTER — Telehealth: Payer: Self-pay | Admitting: *Deleted

## 2023-11-29 ENCOUNTER — Ambulatory Visit (INDEPENDENT_AMBULATORY_CARE_PROVIDER_SITE_OTHER): Payer: MEDICAID | Admitting: Podiatry

## 2023-11-29 ENCOUNTER — Ambulatory Visit (HOSPITAL_COMMUNITY): Admission: RE | Admit: 2023-11-29 | Payer: MEDICAID | Source: Ambulatory Visit

## 2023-11-29 DIAGNOSIS — M216X1 Other acquired deformities of right foot: Secondary | ICD-10-CM

## 2023-11-29 DIAGNOSIS — M216X2 Other acquired deformities of left foot: Secondary | ICD-10-CM

## 2023-11-29 DIAGNOSIS — G8929 Other chronic pain: Secondary | ICD-10-CM

## 2023-11-29 NOTE — Telephone Encounter (Signed)
 I called and left vm for pt to return my call- insurnance has denied his MRI and I was calling to se if he had any type of conservative treatment (PT, home exercises,etc) to try to get the MRI approved. Pendng call back from pt.

## 2023-11-29 NOTE — Progress Notes (Signed)
 Orthotics   Patient was present and evaluated for Custom molded foot orthotics. Patient will benefit from CFO's to provide total contact to BIL MLA's helping to balance and distribute body weight more evenly across BIL feet helping to reduce plantar pressure and pain. Orthotic will also encourage FF / RF alignment  Patient was scanned today and will return for fitting upon receipt

## 2023-11-29 NOTE — Telephone Encounter (Signed)
 Dr. Vicente Graham pt - pt lvm stating that he is doing all he can do to get the MRI done, but his insurance has turned him down.  He stated that Dr. Linnell Richardson told him that they couldn't deny it.  He wanted to know if there's anything else that we can do.  [phone]

## 2023-11-29 NOTE — Progress Notes (Signed)
 Pes planovalgus orthotics  Subjective:  Patient ID: Shawn Pittman, male    DOB: October 05, 1965,  MRN: [id number]  Chief Complaint  Patient presents with   Hammer Toe    58 y.o. male presents with the above complaint.  Patient presents with concern for flatfoot deformity.  He states he is getting some arch and heel pain when I get it evaluated does not wear any orthotics he wears regular shoes.  Pain scale 5 out of 10 dull aching nature   Review of Systems: Negative except as noted in the HPI. Denies N/V/F/Ch.  Past Medical History:  Diagnosis Date   Back pain, chronic    Hyperlipidemia    Hypertension    MRSA (methicillin resistant Staphylococcus aureus)     Current Outpatient Medications:    clonazePAM (KLONOPIN) 0.5 MG tablet, Take 0.5 mg by mouth 2 (two) times daily., Disp: , Rfl:    ALPRAZolam (XANAX) 0.5 MG tablet, Take 0.5 mg by mouth 3 (three) times daily as needed., Disp: , Rfl:    atorvastatin  (LIPITOR) 20 MG tablet, Take 1 tablet by mouth once daily, Disp: 30 tablet, Rfl: 0   diclofenac  (VOLTAREN ) 75 MG EC tablet, Take 1 tablet (75 mg total) by mouth 2 (two) times daily with a meal., Disp: 60 tablet, Rfl: 3   ezetimibe (ZETIA) 10 MG tablet, Take 10 mg by mouth daily., Disp: , Rfl:    lisinopril  (ZESTRIL ) 20 MG tablet, Take 2 tablets (40 mg total) by mouth daily., Disp: 60 tablet, Rfl: 4   lisinopril -hydrochlorothiazide (ZESTORETIC) 20-25 MG tablet, Take 1 tablet by mouth daily., Disp: , Rfl:    methocarbamol  (ROBAXIN ) 500 MG tablet, Take 1 tablet (500 mg total) by mouth 2 (two) times daily., Disp: 20 tablet, Rfl: 0   oxyCODONE -acetaminophen  (PERCOCET/ROXICET) 5-325 MG tablet, One tablet every four hours as need for pain, Disp: 30 tablet, Rfl: 0   rosuvastatin (CRESTOR) 5 MG tablet, Take 5 mg by mouth daily., Disp: , Rfl:    sertraline (ZOLOFT) 100 MG tablet, Take 100 mg by mouth daily., Disp: , Rfl:   Social History   Tobacco Use  Smoking Status Never   Passive exposure:  Past  Smokeless Tobacco Never    No Known Allergies Objective:  There were no vitals filed for this visit. There is no height or weight on file to calculate BMI. Constitutional Well developed. Well nourished.  Vascular Dorsalis pedis pulses palpable bilaterally. Posterior tibial pulses palpable bilaterally. Capillary refill normal to all digits.  No cyanosis or clubbing noted. Pedal hair growth normal.  Neurologic Normal speech. Oriented to person, place, and time. Epicritic sensation to light touch grossly present bilaterally.  Dermatologic Nails well groomed and normal in appearance. No open wounds. No skin lesions.  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 Plan:  Patient was evaluated and treated and all questions answered.  Pes planovalgus -I explained to patient the etiology of pes planovalgus and relationship with Planter fasciitis and various treatment options were discussed.  Given patient foot structure in the setting of Planter fasciitis I believe patient will benefit from custom-made orthotics to help control the hindfoot motion support the arch of the foot and take the stress away from plantar fascial.  Patient agrees with the plan like to proceed with orthotics -Patient was casted for orthotics  -  No follow-ups on file.

## 2023-11-30 NOTE — Telephone Encounter (Signed)
 I called him to discuss / he has medicaid They denied the study he needs physical therapy I sent order told him to expect a call from them  He voiced understanding

## 2023-12-04 ENCOUNTER — Telehealth: Payer: Self-pay | Admitting: Orthopaedic Surgery

## 2023-12-04 NOTE — Telephone Encounter (Signed)
 Dr. Sanjuan Dame pt - pt lvm requesting a refill for Oxycodone 5-325 to be sent to [facility]

## 2023-12-05 MED ORDER — OXYCODONE-ACETAMINOPHEN 5-325 MG PO TABS: ORAL_TABLET | ORAL | 0 refills | Status: DC

## 2023-12-06 ENCOUNTER — Ambulatory Visit: Payer: MEDICAID | Admitting: Orthopedic Surgery

## 2023-12-12 ENCOUNTER — Telehealth: Payer: Self-pay | Admitting: Orthopaedic Surgery

## 2023-12-12 NOTE — Telephone Encounter (Signed)
 Dr. Sanjuan Dame pt - pt lvm requesting a refill for Oxycodone 5-325 to be sent to [facility] in [place]

## 2023-12-13 MED ORDER — OXYCODONE-ACETAMINOPHEN 5-325 MG PO TABS: ORAL_TABLET | ORAL | 0 refills | Status: DC

## 2023-12-19 ENCOUNTER — Telehealth: Payer: Self-pay

## 2023-12-19 MED ORDER — OXYCODONE-ACETAMINOPHEN 5-325 MG PO TABS: ORAL_TABLET | ORAL | 0 refills | Status: DC

## 2023-12-19 NOTE — Therapy (Signed)
 OUTPATIENT PHYSICAL THERAPY THORACOLUMBAR EVALUATION   Patient Name: Shawn Pittman MRN: [id number] DOB:12-01-1965, 58 y.o., male Today's Date: 12/20/2023  END OF SESSION:  PT End of Session - 12/20/23 0723     Visit Number 1    Number of Visits 12    Date for PT Re-Evaluation 01/31/24    Authorization Type Shawn Pittman; please check auth    PT Start Time 0720    PT Stop Time 0800    PT Time Calculation (min) 40 min    Activity Tolerance Patient tolerated treatment well    Behavior During Therapy Shawn Pittman for tasks assessed/performed             Past Medical History:  Diagnosis Date   Back pain, chronic    Hyperlipidemia    Hypertension    MRSA (methicillin resistant Staphylococcus aureus)    Past Surgical History:  Procedure Laterality Date   EYE SURGERY  1989   removed R eye   Patient Active Problem List   Diagnosis Date Noted   Anxiety 05/16/2023   Positive ANA (antinuclear antibody) 03/15/2023   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 03/15/2023   Bilateral hand pain 03/15/2023   Dyspepsia 03/15/2023   Spondylosis of lumbar spine 12/28/2022   Polyarthritis 12/28/2022   Activities involving gardening and landscaping 05/29/2020   Overexertion and strenuous and repetitive movements or loads 05/29/2020   Pain in joint, pelvic region and thigh 05/29/2020   Sprain and strain of shoulder and upper arm 05/29/2020   Sprain lumbar region 05/29/2020   Thoracic aortic aneurysm without rupture (HCC) 08/02/2018   BACK PAIN 06/24/2010   HYPERCHOLESTEROLEMIA 06/15/2010   OBESITY 06/15/2010   CELLULITIS, ARM 12/29/2009   CLOSED FRACTURE UNSPEC PHALANX/PHALANGES HAND 09/22/2009   METHICILLIN RESISTANT STAPHYLOCOCCUS AUREUS INFECTION 09/15/2009   ANXIETY DEPRESSION 09/15/2009   HYPERTENSION, BENIGN ESSENTIAL 09/15/2009   HYPERGLYCEMIA 09/15/2009    PCP: [facility]  REFERRING PROVIDER: Pleasant Brilliant, MD  REFERRING DIAG: M54.42,M54.41,G89.29 (ICD-10-CM) -  Chronic bilateral low back pain with bilateral sciatica  Rationale for Evaluation and Treatment: Rehabilitation  THERAPY DIAG:  Low back pain, unspecified back pain laterality, unspecified chronicity, unspecified whether sciatica present - Plan: PT plan of care cert/re-cert  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  Sometimes right leg pain; Now pain down both legs occasionally.  Back pain is constant.  Recent falls x 2 about a month ago. Went back to PCP and then went back to Shawn Pittman.  Took x-rays and referred to PT.  Ordered MRI but insurance declined.  Wife with him, Shawn Pittman  PERTINENT HISTORY:  Torn rotator cuff on right side; recent MRI; seeing Shawn Pittman for his right shoulder [employer]  PAIN:  Are you having pain? Yes: NPRS scale: 6-7/10 Pain location: low back now; but sometimes in legs Pain description: needles Aggravating factors: sitting too long, laying too long Relieving factors: percocet  PRECAUTIONS: Fall  RED FLAGS: None    WEIGHT BEARING RESTRICTIONS: No  FALLS:  Has patient fallen in last 6 months? Yes. Number of falls 2  fell off a ladder, and off a scaffold  OCCUPATION: construction  PLOF: Independent  PATIENT GOALS: "I don't see where therapy is going to help me"  NEXT MD VISIT: PRN  OBJECTIVE:  Note: Objective measures were completed at Evaluation unless otherwise noted.  DIAGNOSTIC FINDINGS:  Clinical:  lower back pain, chronic with new episode after fall   X-rays  were done of the lumbar spine, five views.   There is loss of lumbar lordosis.  Degenerative changes are present with osteophyte formation multiple vertebrae.  Disc spaces are maintained. I do not appreciate acute fracture.  Bone density is good.   Impression:  diffuse degenerative changes of lumbar spine.  PATIENT SURVEYS:  Modified Oswestry 26/50 52%   COGNITION: Overall cognitive status: Within functional limits for tasks assessed     SENSATION: WFL  MUSCLE LENGTH: Cramping in hamstrings with bridge  POSTURE: rounded shoulders and decreased lumbar lordosis  PALPATION: Mild tenderness low back and glutes  LUMBAR ROM:   AROM eval  Flexion Fingertips to floor  Extension 65% available  Right lateral flexion Fingertips to 2" above knee joint line  Left lateral flexion Fingertips to 1" above knee joint line  Right rotation   Left rotation    (Blank rows = not tested)  LOWER EXTREMITY ROM:     Active  Right eval Left eval  Hip flexion    Hip extension    Hip abduction    Hip adduction    Hip internal rotation    Hip external rotation    Knee flexion    Knee extension    Ankle dorsiflexion    Ankle plantarflexion    Ankle inversion    Ankle eversion     (Blank rows = not tested)  LOWER EXTREMITY MMT:    MMT Right eval Left eval  Hip flexion 4+ 4+  Hip extension 3+*painful 3+ *painful  Hip abduction    Hip adduction    Hip internal rotation    Hip external rotation    Knee flexion 5 5  Knee extension 5 5  Ankle dorsiflexion 5 5  Ankle plantarflexion    Ankle inversion    Ankle eversion     (Blank rows = not tested)   FUNCTIONAL TESTS:  5 times sit to stand: 12.23 sec no UE assist   GAIT: Distance walked: 50 ft in clinic Assistive device utilized: None Level of assistance: Complete Independence Comments: slow gait speed  TREATMENT DATE: 12/20/23 physical therapy evaluation and HEP instruction  PATIENT EDUCATION:  Education details: Patient educated on exam findings, POC, scope of PT, HEP, and what to expect next visit. Person educated: Patient Education method: Explanation, Demonstration, and Handouts Education comprehension: verbalized understanding, returned demonstration, verbal cues required, and tactile cues required  HOME EXERCISE PROGRAM: Access Code: [email or web address] URL: [email or web address] Date: 12/20/2023 Prepared by: AP - Rehab  Exercises - Prone Hip Extension  - 2 x daily - 7 x weekly - 1 sets - 10 reps - Supine Transversus Abdominis Bracing - Hands on Stomach  - 2 x daily - 7 x weekly - 1 sets - 10 reps - Supine Bridge  - 2 x daily - 7 x weekly - 1 sets - 10 reps  ASSESSMENT:  CLINICAL IMPRESSION: Patient is a 58 y.o. male who was seen today for physical therapy evaluation and treatment for M54.42,M54.41,G89.29 (ICD-10-CM) - Chronic bilateral low back pain with bilateral sciatica.  Patient demonstrates muscle weakness, reduced ROM, and fascial restrictions which are likely contributing to symptoms of pain and are negatively impacting patient ability to perform ADLs and functional mobility tasks. Patient will benefit from skilled physical therapy services to address these deficits to reduce pain and improve level of function with ADLs and functional mobility tasks.   OBJECTIVE IMPAIRMENTS: decreased activity tolerance, decreased ROM, decreased strength, impaired perceived functional ability, and pain.   ACTIVITY LIMITATIONS: carrying, lifting, bending, sitting, standing, squatting, sleeping, stairs, transfers, bed mobility, locomotion level, and caring for others  PARTICIPATION LIMITATIONS: meal prep, cleaning, laundry, community activity, occupation, and yard work  PERSONAL FACTORS: Profession are also affecting patient's functional outcome.   REHAB POTENTIAL:  Good  CLINICAL DECISION MAKING: Evolving/moderate complexity  EVALUATION COMPLEXITY: Moderate   GOALS: Goals reviewed with patient? No  SHORT TERM GOALS: Target date: 01/10/2024  patient will be independent with initial HEP  Baseline: Goal status: INITIAL  2.  Patient will report 30% improvement overall  Baseline:  Goal status: INITIAL   LONG TERM GOALS: Target date: 01/31/2024  Patient will be independent in self management strategies to improve quality of life and functional outcomes.  Baseline:  Goal status: INITIAL  2.  Patient will report 50% improvement overall  Baseline:  Goal status: INITIAL  3.  Patient will improve Modified Oswestry score by 6 points to demonstrate improved perceived function  Baseline: 26/50 Goal status: INITIAL  4.   Patient will increase bilateral leg MMT's to 5/5 to allow navigation of steps without gait deviation or loss of balance   Baseline: see above Goal status: INITIAL  5.  Patient will improve 5 times sit to stand score to 10 sec or less to demonstrate improved functional mobility and increased leg strength.    Baseline: 12.23 sec Goal status: INITIAL  PLAN:  PT FREQUENCY: 2x/week  PT DURATION: 6 weeks  PLANNED INTERVENTIONS: 97164- PT Re-evaluation, 97110-Therapeutic exercises, 97530- Therapeutic activity, 97112- Neuromuscular re-education, 97535- Self Care, 54098- Manual therapy, [phone]- Gait training, [phone]- Orthotic Fit/training, [phone]- Canalith repositioning, [id number]- Aquatic Therapy, [phone]- Splinting, Patient/Family education, Balance training, Stair training, Taping, Dry Needling, Joint mobilization, Joint manipulation, Spinal manipulation, Spinal mobilization, Scar mobilization, and DME instructions. Aaron Aas  PLAN FOR NEXT SESSION: Review HEP and goals; lumbar decompression, core and postural strengthening; lifting technique   8:46 AM, 12/20/23 Hollyanne Schloesser Small Jaeanna Mccomber MPT [facility] [place]  [phone] Ph:[phone]   Managed Medicaid Authorization Request  Visit Dx Codes: M54.50, M51.362  Functional Tool Score: Modified Oswestry 26/50 52%  For all possible CPT codes, reference the Planned Interventions  line above.     Check all conditions that are expected to impact treatment: {Conditions expected to impact treatment:None of these apply   If treatment provided at initial evaluation, no treatment charged due to lack of authorization.

## 2023-12-19 NOTE — Telephone Encounter (Signed)
 Oxycodone -Acetaminophen  5/325 MG  Qty 30 Tablets  PATIENT USES [facility]

## 2023-12-20 ENCOUNTER — Ambulatory Visit (HOSPITAL_COMMUNITY): Payer: MEDICAID | Attending: Orthopaedic Surgery

## 2023-12-20 ENCOUNTER — Other Ambulatory Visit: Payer: Self-pay

## 2023-12-20 DIAGNOSIS — G8929 Other chronic pain: Secondary | ICD-10-CM | POA: Diagnosis not present

## 2023-12-20 DIAGNOSIS — M51362 Other intervertebral disc degeneration, lumbar region with discogenic back pain and lower extremity pain: Secondary | ICD-10-CM | POA: Diagnosis present

## 2023-12-20 DIAGNOSIS — M5442 Lumbago with sciatica, left side: Secondary | ICD-10-CM | POA: Insufficient documentation

## 2023-12-20 DIAGNOSIS — M5441 Lumbago with sciatica, right side: Secondary | ICD-10-CM | POA: Diagnosis not present

## 2023-12-20 DIAGNOSIS — M545 Low back pain, unspecified: Secondary | ICD-10-CM | POA: Insufficient documentation

## 2023-12-20 DIAGNOSIS — M51369 Other intervertebral disc degeneration, lumbar region without mention of lumbar back pain or lower extremity pain: Secondary | ICD-10-CM | POA: Insufficient documentation

## 2023-12-26 ENCOUNTER — Telehealth: Payer: Self-pay | Admitting: Orthopaedic Surgery

## 2023-12-26 MED ORDER — OXYCODONE-ACETAMINOPHEN 5-325 MG PO TABS: ORAL_TABLET | ORAL | 0 refills | Status: DC

## 2023-12-26 NOTE — Telephone Encounter (Signed)
 Dr. Sanjuan Dame pt - pt lvm requesting a refill for Oxycodone 5-325 to be sent to [facility] in [place]

## 2023-12-27 ENCOUNTER — Encounter (HOSPITAL_COMMUNITY): Payer: MEDICAID

## 2023-12-27 ENCOUNTER — Ambulatory Visit (HOSPITAL_COMMUNITY): Payer: MEDICAID | Attending: Orthopaedic Surgery

## 2023-12-27 ENCOUNTER — Encounter (HOSPITAL_COMMUNITY): Payer: Self-pay

## 2023-12-27 DIAGNOSIS — M545 Low back pain, unspecified: Secondary | ICD-10-CM | POA: Insufficient documentation

## 2023-12-27 DIAGNOSIS — M51362 Other intervertebral disc degeneration, lumbar region with discogenic back pain and lower extremity pain: Secondary | ICD-10-CM | POA: Insufficient documentation

## 2023-12-27 NOTE — Therapy (Signed)
 OUTPATIENT PHYSICAL THERAPY THORACOLUMBAR EVALUATION   Patient Name: Shawn Pittman MRN: [id number] DOB:[birth year], 58 y.o., male Today's Date: 12/27/2023  END OF SESSION:  PT End of Session - 12/27/23 1559     Visit Number 2    Number of Visits 12    Date for PT Re-Evaluation 01/31/24    Authorization Type Cheryll Corti; please check auth    PT Start Time 1600    PT Stop Time 1645    PT Time Calculation (min) 45 min    Activity Tolerance Patient tolerated treatment well    Behavior During Therapy [facility] for tasks assessed/performed              Past Medical History:  Diagnosis Date   Back pain, chronic    Hyperlipidemia    Hypertension    MRSA (methicillin resistant Staphylococcus aureus)    Past Surgical History:  Procedure Laterality Date   EYE SURGERY  1989   removed R eye   Patient Active Problem List   Diagnosis Date Noted   Anxiety 05/16/2023   Positive ANA (antinuclear antibody) 03/15/2023   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 03/15/2023   Bilateral hand pain 03/15/2023   Dyspepsia 03/15/2023   Spondylosis of lumbar spine 12/28/2022   Polyarthritis 12/28/2022   Activities involving gardening and landscaping 05/29/2020   Overexertion and strenuous and repetitive movements or loads 05/29/2020   Pain in joint, pelvic region and thigh 05/29/2020   Sprain and strain of shoulder and upper arm 05/29/2020   Sprain lumbar region 05/29/2020   Thoracic aortic aneurysm without rupture (HCC) 08/02/2018   BACK PAIN 06/24/2010   HYPERCHOLESTEROLEMIA 06/15/2010   OBESITY 06/15/2010   CELLULITIS, ARM 12/29/2009   CLOSED FRACTURE UNSPEC PHALANX/PHALANGES HAND 09/22/2009   METHICILLIN RESISTANT STAPHYLOCOCCUS AUREUS INFECTION 09/15/2009   ANXIETY DEPRESSION 09/15/2009   HYPERTENSION, BENIGN ESSENTIAL 09/15/2009   HYPERGLYCEMIA 09/15/2009    [facility]P: [facility]  REFERRING PROVIDER: Pleasant Brilliant, MD  REFERRING DIAG: M54.42,M54.41,G89.29 (ICD-10-CM) -  Chronic bilateral low back pain with bilateral sciatica  Rationale for Evaluation and Treatment: Rehabilitation  THERAPY DIAG:  Low back pain, unspecified back pain laterality, unspecified chronicity, unspecified whether sciatica present  Degeneration of intervertebral disc of lumbar region with discogenic back pain and lower extremity pain  ONSET DATE: 1991; worse with recent falls about a month ago  SUBJECTIVE:                                                                                                                                                                                           SUBJECTIVE STATEMENT: Pt states the back pain has  been the same as the beginning. Pt states pain stays above a 10 consistently today 12/10. 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 Pt states he is willing to do a few more weeks of therapy to get approval for MRI.  Back pain since 1991; fell 60 ft back then; caused 4 bulging discs L3-L5; worsening over the years.  Sometimes right leg pain; Now pain down both legs occasionally.  Back pain is constant.  Recent falls x 2 about a month ago. Went back to PCP and then went back to [place].  Took x-rays and referred to PT.  Ordered MRI but insurance declined.  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 Yes: NPRS scale: 6-7/10 Pain location: low back now; but sometimes in legs Pain description: needles Aggravating factors: sitting too long, laying too long Relieving factors: percocet  PRECAUTIONS: Fall  RED FLAGS: None    WEIGHT BEARING RESTRICTIONS: No  FALLS:  Has patient fallen in last 6 months? Yes. Number of falls 2  fell off a ladder, and off a scaffold  OCCUPATION: construction  PLOF: Independent  PATIENT GOALS: "I don't see where therapy is going to help me"  NEXT MD VISIT:  PRN  OBJECTIVE:  Note: Objective measures were completed at Evaluation unless otherwise noted.  DIAGNOSTIC FINDINGS:  Clinical:  lower back pain, chronic with new episode after fall   X-rays were done of the lumbar spine, five views.   There is loss of lumbar lordosis.  Degenerative changes are present with osteophyte formation multiple vertebrae.  Disc spaces are maintained. I do not appreciate acute fracture.  Bone density is good.   Impression:  diffuse degenerative changes of lumbar spine.  PATIENT SURVEYS:  Modified Oswestry 26/50 52%   COGNITION: Overall cognitive status: Within functional limits for tasks assessed     SENSATION: WFL  MUSCLE LENGTH: Cramping in hamstrings with bridge  POSTURE: rounded shoulders and decreased lumbar lordosis  PALPATION: Mild tenderness low back and glutes  LUMBAR ROM:   AROM eval  Flexion Fingertips to floor  Extension 65% available  Right lateral flexion Fingertips to 2" above knee joint line  Left lateral flexion Fingertips to 1" above knee joint line  Right rotation   Left rotation    (Blank rows = not tested)  LOWER EXTREMITY ROM:     Active  Right eval Left eval  Hip flexion    Hip extension    Hip abduction    Hip adduction    Hip internal rotation    Hip external rotation    Knee flexion    Knee extension    Ankle dorsiflexion    Ankle plantarflexion    Ankle inversion    Ankle eversion     (Blank rows = not tested)  LOWER EXTREMITY MMT:    MMT Right eval Left eval  Hip flexion 4+ 4+  Hip extension 3+*painful 3+ *painful  Hip abduction    Hip adduction    Hip internal rotation    Hip external rotation    Knee flexion 5 5  Knee extension 5 5  Ankle dorsiflexion 5 5  Ankle plantarflexion    Ankle inversion    Ankle eversion     (Blank rows = not tested)   FUNCTIONAL TESTS:  5 times sit to stand: 12.23 sec no UE assist  GAIT: Distance walked: 50 ft in clinic Assistive device utilized:  None Level of assistance: Complete Independence Comments: slow gait speed  TREATMENT DATE:  12/27/2023  Therapeutic Exercise: -DKTC, 2 sets of 10 reps -Supine bridges 2 sets of 10 reps, 3 second holds, RTB at knees, pt cued for max hip extension -Lateral stepping/monster walks, 3 laps 20 feet per lap, with RTB around ankles, pt cued for upright posture and feet pointing forward -Forward lunges on bosu ball, 1 set of 9,  pt cued for core activation and upright posture -LTR, 2 set of 10 reps bilaterally, pt cued to remain in pain free ROM -Bird dogs, 1 set of 9 reps bilaterally -Prone Press up to child's pose, 1 set of 7 reps  Therapeutic Activity: -Sit to stands, 2 sets of 6 reps, throughout session -Step ups, 12 inch step, 3 bouts, 6 stairs, pt states this was like his ladder climbing    12/20/23 physical therapy evaluation and HEP instruction                                                                                                                                 PATIENT EDUCATION:  Education details: Patient educated on exam findings, POC, scope of PT, HEP, and what to expect next visit. Person educated: Patient Education method: Explanation, Demonstration, and Handouts Education comprehension: verbalized understanding, returned demonstration, verbal cues required, and tactile cues required  HOME EXERCISE PROGRAM: Access Code: [email or web address] URL: [email or web address] Date: 12/20/2023 Prepared by: AP - Rehab  Exercises - Prone Hip Extension  - 2 x daily - 7 x weekly - 1 sets - 10 reps - Supine Transversus Abdominis Bracing - Hands on Stomach  - 2 x daily - 7 x weekly - 1 sets - 10 reps - Supine Bridge  - 2 x daily - 7 x weekly - 1 sets - 10 reps  ASSESSMENT:  CLINICAL IMPRESSION: Patient continues to demonstrate low back pain and impaired balance, although demonstrates impressive LE strength.. Patient also demonstrates decreased endurance with higher intensity  exercise with audible change in breathing during today's session. Patient able to progress dynamic balance and core activation exercises today with quadruped activities although right shoulder limits ROM and thread the needle stopped short due to shoulder discomfort. Patient would continue to benefit from skilled physical therapy for decreased low back pain, increased core strength and endurance, maintained LE strength, and improved balance for improved quality of life, improved independence with ADLs and continued progress towards therapy goals.   Patient is a 58 y.o. male who was seen today for physical therapy evaluation and treatment for M54.42,M54.41,G89.29 (ICD-10-CM) - Chronic bilateral low back pain with bilateral sciatica.  Patient demonstrates muscle weakness, reduced ROM, and fascial restrictions which are likely contributing to symptoms of pain and are negatively impacting patient ability to perform ADLs and functional mobility tasks. Patient will benefit from skilled physical therapy services to address these deficits to reduce  pain and improve level of function with ADLs and functional mobility tasks.   OBJECTIVE IMPAIRMENTS: decreased activity tolerance, decreased ROM, decreased strength, impaired perceived functional ability, and pain.   ACTIVITY LIMITATIONS: carrying, lifting, bending, sitting, standing, squatting, sleeping, stairs, transfers, bed mobility, locomotion level, and caring for others  PARTICIPATION LIMITATIONS: meal prep, cleaning, laundry, community activity, occupation, and yard work  PERSONAL FACTORS: Profession are also affecting patient's functional outcome.   REHAB POTENTIAL: Good  CLINICAL DECISION MAKING: Evolving/moderate complexity  EVALUATION COMPLEXITY: Moderate   GOALS: Goals reviewed with patient? No  SHORT TERM GOALS: Target date: 01/10/2024  patient will be independent with initial HEP  Baseline: Goal status: INITIAL  2.  Patient will  report 30% improvement overall  Baseline:  Goal status: INITIAL   LONG TERM GOALS: Target date: 01/31/2024  Patient will be independent in self management strategies to improve quality of life and functional outcomes.  Baseline:  Goal status: INITIAL  2.  Patient will report 50% improvement overall  Baseline:  Goal status: INITIAL  3.  Patient will improve Modified Oswestry score by 6 points to demonstrate improved perceived function  Baseline: 26/50 Goal status: INITIAL  4.   Patient will increase bilateral leg MMT's to 5/5 to allow navigation of steps without gait deviation or loss of balance   Baseline: see above Goal status: INITIAL  5.  Patient will improve 5 times sit to stand score to 10 sec or less to demonstrate improved functional mobility and increased leg strength.    Baseline: 12.23 sec Goal status: INITIAL  PLAN:  PT FREQUENCY: 2x/week  PT DURATION: 6 weeks  PLANNED INTERVENTIONS: 97164- PT Re-evaluation, 97110-Therapeutic exercises, 97530- Therapeutic activity, 97112- Neuromuscular re-education, 97535- Self Care, 16109- Manual therapy, [phone]- Gait training, [phone]- Orthotic Fit/training, [phone]- Canalith repositioning, [id number]- Aquatic Therapy, [phone]- Splinting, Patient/Family education, Balance training, Stair training, Taping, Dry Needling, Joint mobilization, Joint manipulation, Spinal manipulation, Spinal mobilization, Scar mobilization, and DME instructions. Aaron Aas  PLAN FOR NEXT SESSION: Review HEP and goals; lumbar decompression, core and postural strengthening; lifting technique   Armond Bertin, PT, DPT [facility] Office: [phone] 4:59 PM, 12/27/23

## 2023-12-29 ENCOUNTER — Encounter (HOSPITAL_COMMUNITY): Payer: MEDICAID

## 2024-01-01 ENCOUNTER — Encounter (HOSPITAL_COMMUNITY): Payer: Self-pay

## 2024-01-01 ENCOUNTER — Encounter (HOSPITAL_COMMUNITY): Payer: MEDICAID

## 2024-01-01 NOTE — Therapy (Signed)
 [facility] Rehabilitation at [facility] [address] [place], [place], [address] Phone: [phone]   Fax:  [phone]  Patient Details  Name: Shawn Pittman MRN: [id number] Date of Birth: [birth year] Referring Provider:  No ref. provider found  Encounter Date: 01/01/2024  Pt called regarding no show #1. Left voicemail on pt's wife mailbox. Pt informed/reminded of no show policy.   Gatha Kaska, PT 01/01/2024, 3:54 PM  [facility] at [facility] [address] [place], [place], [address] Phone: [phone]   Fax:  [phone]

## 2024-01-02 ENCOUNTER — Telehealth: Payer: Self-pay

## 2024-01-02 MED ORDER — OXYCODONE-ACETAMINOPHEN 5-325 MG PO TABS: ORAL_TABLET | ORAL | 0 refills | Status: DC

## 2024-01-02 NOTE — Telephone Encounter (Signed)
 Oxycodone -Acetaminophen  5/325 MG  Qty 30 Tablets  PATIENT USES [facility]

## 2024-01-03 ENCOUNTER — Telehealth: Payer: Self-pay | Admitting: Orthopaedic Surgery

## 2024-01-03 NOTE — Telephone Encounter (Signed)
 Dr. 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 Anell Baptist assistant [phone]

## 2024-01-04 ENCOUNTER — Encounter (HOSPITAL_COMMUNITY): Payer: MEDICAID

## 2024-01-04 NOTE — Telephone Encounter (Signed)
 Payten called back, returning Sabrina's call [phone]

## 2024-01-04 NOTE — Telephone Encounter (Signed)
I called and left vm to return my call

## 2024-01-08 ENCOUNTER — Other Ambulatory Visit: Payer: MEDICAID

## 2024-01-09 ENCOUNTER — Ambulatory Visit (HOSPITAL_COMMUNITY): Payer: MEDICAID

## 2024-01-09 ENCOUNTER — Encounter (HOSPITAL_COMMUNITY): Payer: Self-pay

## 2024-01-09 ENCOUNTER — Telehealth: Payer: Self-pay | Admitting: Orthopaedic Surgery

## 2024-01-09 DIAGNOSIS — M545 Low back pain, unspecified: Secondary | ICD-10-CM

## 2024-01-09 DIAGNOSIS — M51362 Other intervertebral disc degeneration, lumbar region with discogenic back pain and lower extremity pain: Secondary | ICD-10-CM

## 2024-01-09 MED ORDER — OXYCODONE-ACETAMINOPHEN 5-325 MG PO TABS: ORAL_TABLET | ORAL | 0 refills | Status: DC

## 2024-01-09 NOTE — Therapy (Signed)
 OUTPATIENT PHYSICAL THERAPY THORACOLUMBAR EVALUATION   Patient Name: Shawn Pittman MRN: [id number] DOB:11-19-1965, 58 y.o., male Today's Date: 01/09/2024  END OF SESSION:  PT End of Session - 01/09/24 0720     Visit Number 3    Number of Visits 12    Date for PT Re-Evaluation 01/31/24    Authorization Type Cheryll Corti    Authorization Time Period [employer] approved 8 visits from 12/20/23-06/17/24    Authorization - Visit Number 3    Authorization - Number of Visits 8    Progress Note Due on Visit 8    PT Start Time 0720    PT Stop Time 0758    PT Time Calculation (min) 38 min    Activity Tolerance Patient tolerated treatment well    Behavior During Therapy [facility] for tasks assessed/performed           Past Medical History:  Diagnosis Date   Back pain, chronic    Hyperlipidemia    Hypertension    MRSA (methicillin resistant Staphylococcus aureus)    Past Surgical History:  Procedure Laterality Date   EYE SURGERY  1989   removed R eye   Patient Active Problem List   Diagnosis Date Noted   Anxiety 05/16/2023   Positive ANA (antinuclear antibody) 03/15/2023   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 03/15/2023   Bilateral hand pain 03/15/2023   Dyspepsia 03/15/2023   Spondylosis of lumbar spine 12/28/2022   Polyarthritis 12/28/2022   Activities involving gardening and landscaping 05/29/2020   Overexertion and strenuous and repetitive movements or loads 05/29/2020   Pain in joint, pelvic region and thigh 05/29/2020   Sprain and strain of shoulder and upper arm 05/29/2020   Sprain lumbar region 05/29/2020   Thoracic aortic aneurysm without rupture (HCC) 08/02/2018   BACK PAIN 06/24/2010   HYPERCHOLESTEROLEMIA 06/15/2010   OBESITY 06/15/2010   CELLULITIS, ARM 12/29/2009   CLOSED FRACTURE UNSPEC PHALANX/PHALANGES HAND 09/22/2009   METHICILLIN RESISTANT STAPHYLOCOCCUS AUREUS INFECTION 09/15/2009   ANXIETY DEPRESSION 09/15/2009   HYPERTENSION, BENIGN ESSENTIAL 09/15/2009    HYPERGLYCEMIA 09/15/2009    PCP: [facility]  REFERRING PROVIDER: Pleasant Brilliant, MD  REFERRING DIAG: M54.42,M54.41,G89.29 (ICD-10-CM) - Chronic bilateral low back pain with bilateral sciatica  Rationale for Evaluation and Treatment: Rehabilitation  THERAPY DIAG:  Low back pain, unspecified back pain laterality, unspecified chronicity, unspecified whether sciatica present  Degeneration of intervertebral disc of lumbar region with discogenic back pain and lower extremity pain  ONSET DATE: 1991; worse with recent falls about a month ago  SUBJECTIVE:  SUBJECTIVE STATEMENT: Pt reports he continues to be limited by lower back pain, pain scale 7-8/10 with radicular symptoms down Rt LE to heel.  Pt stated he does a lot of heaving working, reports he is tired following work, does some of the stretches at home.  Reports willing to do a few more weeks of therapy to get approval for MRI.  Not sure when scheduled to see MD.  Back pain since 1991; fell 60 ft back then; caused 4 bulging discs L3-L5; worsening over the years.  Sometimes right leg pain; Now pain down both legs occasionally.  Back pain is constant.  Recent falls x 2 about a month ago. Went back to PCP and then went back to [place].  Took x-rays and referred to PT.  Ordered MRI but insurance declined.  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 Yes: NPRS scale: 7-8/10 Pain location: low back now; but sometimes in legs Pain description: needles Aggravating factors: sitting too long, laying too long Relieving factors: percocet  PRECAUTIONS: Fall  RED FLAGS: None    WEIGHT BEARING RESTRICTIONS: No  FALLS:   Has patient fallen in last 6 months? Yes. Number of falls 2  fell off a ladder, and off a scaffold  OCCUPATION: construction  PLOF: Independent  PATIENT GOALS: I don't see where therapy is going to help me  NEXT MD VISIT: PRN  OBJECTIVE:  Note: Objective measures were completed at Evaluation unless otherwise noted.  DIAGNOSTIC FINDINGS:  Clinical:  lower back pain, chronic with new episode after fall   X-rays were done of the lumbar spine, five views.   There is loss of lumbar lordosis.  Degenerative changes are present with osteophyte formation multiple vertebrae.  Disc spaces are maintained. I do not appreciate acute fracture.  Bone density is good.   Impression:  diffuse degenerative changes of lumbar spine.  PATIENT SURVEYS:  Modified Oswestry 26/50 52%   COGNITION: Overall cognitive status: Within functional limits for tasks assessed     SENSATION: WFL  MUSCLE LENGTH: Cramping in hamstrings with bridge  POSTURE: rounded shoulders and decreased lumbar lordosis  PALPATION: Mild tenderness low back and glutes  LUMBAR ROM:   AROM eval  Flexion Fingertips to floor  Extension 65% available  Right lateral flexion Fingertips to 2 above knee joint line  Left lateral flexion Fingertips to 1 above knee joint line  Right rotation   Left rotation    (Blank rows = not tested)  LOWER EXTREMITY ROM:     Active  Right eval Left eval  Hip flexion    Hip extension    Hip abduction    Hip adduction    Hip internal rotation    Hip external rotation    Knee flexion    Knee extension    Ankle dorsiflexion    Ankle plantarflexion    Ankle inversion    Ankle eversion     (Blank rows = not tested)  LOWER EXTREMITY MMT:    MMT Right eval Left eval  Hip flexion 4+ 4+  Hip extension 3+*painful 3+ *painful  Hip abduction    Hip adduction    Hip internal rotation    Hip external rotation    Knee flexion 5 5  Knee extension 5 5  Ankle dorsiflexion 5 5   Ankle plantarflexion    Ankle inversion  Ankle eversion     (Blank rows = not tested)   FUNCTIONAL TESTS:  5 times sit to stand: 12.23 sec no UE assist   GAIT: Distance walked: 50 ft in clinic Assistive device utilized: None Level of assistance: Complete Independence Comments: slow gait speed  TREATMENT DATE:  01/09/24: - BTB shoulder extension 15x - BTB rows - BTB shoulder extension with marching 10x5 - Squats 15x   Trial with theraball lumbar traction with no relief x 2 min  Supine: - Core and oblique theraball 10x 5 - Hamstring stretch 3x 30 - Piriformis stretch with towel 2x 30  12/27/2023  Therapeutic Exercise: -DKTC, 2 sets of 10 reps -Supine bridges 2 sets of 10 reps, 3 second holds, RTB at knees, pt cued for max hip extension -Lateral stepping/monster walks, 3 laps 20 feet per lap, with RTB around ankles, pt cued for upright posture and feet pointing forward -Forward lunges on bosu ball, 1 set of 9,  pt cued for core activation and upright posture -LTR, 2 set of 10 reps bilaterally, pt cued to remain in pain free ROM -Bird dogs, 1 set of 9 reps bilaterally -Prone Press up to child's pose, 1 set of 7 reps  Therapeutic Activity: -Sit to stands, 2 sets of 6 reps, throughout session -Step ups, 12 inch step, 3 bouts, 6 stairs, pt states this was like his ladder climbing    12/20/23 physical therapy evaluation and HEP instruction                                                                                                                                 PATIENT EDUCATION:  Education details: Patient educated on exam findings, POC, scope of PT, HEP, and what to expect next visit. Person educated: Patient Education method: Explanation, Demonstration, and Handouts Education comprehension: verbalized understanding, returned demonstration, verbal cues required, and tactile cues required  HOME EXERCISE PROGRAM: Access Code: [email or web address] URL:  [email or web address] Date: 12/20/2023 Prepared by: AP - Rehab  Exercises - Prone Hip Extension  - 2 x daily - 7 x weekly - 1 sets - 10 reps - Supine Transversus Abdominis Bracing - Hands on Stomach  - 2 x daily - 7 x weekly - 1 sets - 10 reps - Supine Bridge  - 2 x daily - 7 x weekly - 1 sets - 10 reps  ASSESSMENT:  CLINICAL IMPRESSION: Session focus with core and proximal strengthening and stretches for mobility.  Educated importance of posture for pain control and added stretches for mobility.  Added squats for functional strengthening.  Pt able to demonstrate good form and mechanics with all new exercises.  Presents with decreased lordotic curvature and tightness in hamstrings.  Educated benefits of increased time with stretches for maximal benefits with stretches.  Pt continues to be limited by pain, monitored through session.  No reports of increased pain through session except for Rt shoulder following theraband exercise.  Patient is  a 58 y.o. male who was seen today for physical therapy evaluation and treatment for M54.42,M54.41,G89.29 (ICD-10-CM) - Chronic bilateral low back pain with bilateral sciatica.  Patient demonstrates muscle weakness, reduced ROM, and fascial restrictions which are likely contributing to symptoms of pain and are negatively impacting patient ability to perform ADLs and functional mobility tasks. Patient will benefit from skilled physical therapy services to address these deficits to reduce pain and improve level of function with ADLs and functional mobility tasks.   OBJECTIVE IMPAIRMENTS: decreased activity tolerance, decreased ROM, decreased strength, impaired perceived functional ability, and pain.   ACTIVITY LIMITATIONS: carrying, lifting, bending, sitting, standing, squatting, sleeping, stairs, transfers, bed mobility, locomotion level, and caring for others  PARTICIPATION LIMITATIONS: meal prep, cleaning, laundry, community activity,  occupation, and yard work  PERSONAL FACTORS: Profession are also affecting patient's functional outcome.   REHAB POTENTIAL: Good  CLINICAL DECISION MAKING: Evolving/moderate complexity  EVALUATION COMPLEXITY: Moderate   GOALS: Goals reviewed with patient? No  SHORT TERM GOALS: Target date: 01/10/2024  patient will be independent with initial HEP  Baseline: Goal status: INITIAL  2.  Patient will report 30% improvement overall  Baseline:  Goal status: INITIAL   LONG TERM GOALS: Target date: 01/31/2024  Patient will be independent in self management strategies to improve quality of life and functional outcomes.  Baseline:  Goal status: INITIAL  2.  Patient will report 50% improvement overall  Baseline:  Goal status: INITIAL  3.  Patient will improve Modified Oswestry score by 6 points to demonstrate improved perceived function  Baseline: 26/50 Goal status: INITIAL  4.   Patient will increase bilateral leg MMT's to 5/5 to allow navigation of steps without gait deviation or loss of balance   Baseline: see above Goal status: INITIAL  5.  Patient will improve 5 times sit to stand score to 10 sec or less to demonstrate improved functional mobility and increased leg strength.    Baseline: 12.23 sec Goal status: INITIAL  PLAN:  PT FREQUENCY: 2x/week  PT DURATION: 6 weeks  PLANNED INTERVENTIONS: 97164- PT Re-evaluation, 97110-Therapeutic exercises, 97530- Therapeutic activity, 97112- Neuromuscular re-education, 97535- Self Care, [address]- Manual therapy, [phone]- Gait training, [phone]- Orthotic Fit/training, [phone]- Canalith repositioning, [id number]- Aquatic Therapy, [phone]- Splinting, Patient/Family education, Balance training, Stair training, Taping, Dry Needling, Joint mobilization, Joint manipulation, Spinal manipulation, Spinal mobilization, Scar mobilization, and DME instructions. Aaron Aas  PLAN FOR NEXT SESSION: lumbar decompression, core and postural strengthening; lifting  technique  Minor Amble, LPTA/CLT; CBIS [phone]  9:53 AM, 01/09/24

## 2024-01-09 NOTE — Telephone Encounter (Signed)
 Dr. Sanjuan Dame pt - pt lvm requesting a refill for Oxycodone 5-325 to be sent to [facility] in [place]

## 2024-01-12 ENCOUNTER — Encounter (HOSPITAL_COMMUNITY): Payer: MEDICAID

## 2024-01-12 ENCOUNTER — Encounter (HOSPITAL_COMMUNITY): Payer: Self-pay

## 2024-01-12 NOTE — Therapy (Signed)
 [facility] Outpatient Rehabilitation at [facility] [address] [place], [place], [address] Phone: [phone]   Fax:  [phone]  Patient Details  Name: Shawn Pittman MRN: [id number] Date of Birth: 09/18/1965 Referring Provider:  No ref. provider found  Encounter Date: 01/12/2024  Pt called regarding no show #2. Left voicemail regard no show policy.    Gatha Kaska, PT 01/12/2024, 3:42 PM  [facility] Outpatient Rehabilitation at [facility] [address] [place], [place], [address] Phone: [phone]   Fax:  [phone]

## 2024-01-16 ENCOUNTER — Encounter (HOSPITAL_COMMUNITY): Payer: MEDICAID

## 2024-01-16 ENCOUNTER — Other Ambulatory Visit: Payer: Self-pay | Admitting: Orthopaedic Surgery

## 2024-01-16 ENCOUNTER — Telehealth (HOSPITAL_COMMUNITY): Payer: Self-pay | Admitting: Physical Therapy

## 2024-01-16 ENCOUNTER — Ambulatory Visit: Payer: MEDICAID | Admitting: Adult Health

## 2024-01-16 MED ORDER — OXYCODONE-ACETAMINOPHEN 5-325 MG PO TABS: ORAL_TABLET | ORAL | 0 refills | Status: DC

## 2024-01-16 NOTE — Telephone Encounter (Signed)
 Pt did not show for appt.  Today makes 3rd NS and is now discharged per policy.  Messaged evaluating therapist regarding discharge. Pt has no further PT appts scheduled  Greig KATHEE Fuse, PTA/CLT [facility] Outpatient Rehabilitation [facility] Ph: [phone]

## 2024-01-16 NOTE — Telephone Encounter (Signed)
 Dr. Janae pt - pt lvm requesting a refill for Oxycodone  5-325, 30 tablets, as directed to be sent to [facility] in [place].

## 2024-01-17 ENCOUNTER — Other Ambulatory Visit: Payer: Self-pay | Admitting: Orthopaedic Surgery

## 2024-01-17 NOTE — Telephone Encounter (Signed)
 DR. BRENNA   PATIENT CALLED LVM WANTS REFILL ON HIS PAIN MEDICINE    oxyCODONE -acetaminophen  (PERCOCET/ROXICET) 5-325 MG tablet   PHARMACY   [facility]

## 2024-01-19 ENCOUNTER — Encounter (HOSPITAL_COMMUNITY): Payer: MEDICAID

## 2024-01-23 ENCOUNTER — Encounter (HOSPITAL_COMMUNITY): Payer: Self-pay

## 2024-01-23 ENCOUNTER — Encounter (HOSPITAL_COMMUNITY): Payer: MEDICAID

## 2024-01-23 ENCOUNTER — Telehealth: Payer: Self-pay | Admitting: Orthopaedic Surgery

## 2024-01-23 MED ORDER — OXYCODONE-ACETAMINOPHEN 5-325 MG PO TABS: ORAL_TABLET | ORAL | 0 refills | Status: DC

## 2024-01-23 NOTE — Telephone Encounter (Signed)
 DR. BRENNA   Patient called request refill on pain medicine    oxyCODONE -acetaminophen  (PERCOCET/ROXICET) 5-325 MG tablet   Pharmacy:  [facility] in [place]

## 2024-01-23 NOTE — Therapy (Signed)
 [facility] Outpatient Rehabilitation at [facility] [address] [place], [place], [address] Phone: [phone]   Fax:  [phone]  Patient Details  Name: Shawn Pittman MRN: [id number] Date of Birth: 1966-03-31 Referring Provider:  No ref. provider found  PHYSICAL THERAPY DISCHARGE SUMMARY  Visits from Start of Care: 3  Current functional level related to goals / functional outcomes: unknown   Remaining deficits: unknown   Education / Equipment: HEP   Patient agrees to discharge. Patient goals were not met. Patient is being discharged due to not returning since the last visit.   Greig GORMAN Quivers, PT 01/23/2024, 7:13 AM  [facility] Outpatient Rehabilitation at [facility] [address] [place], [place], [address] Phone: [phone]   Fax:  [phone]

## 2024-01-25 ENCOUNTER — Encounter (HOSPITAL_COMMUNITY): Payer: MEDICAID

## 2024-01-29 ENCOUNTER — Encounter (HOSPITAL_COMMUNITY): Payer: MEDICAID

## 2024-01-30 ENCOUNTER — Telehealth: Payer: Self-pay | Admitting: Orthopaedic Surgery

## 2024-01-30 NOTE — Telephone Encounter (Signed)
 Dr. Sanjuan Dame pt - pt lvm requesting a refill for Oxycodone 5-325 to be sent to [facility] in [place]

## 2024-01-31 MED ORDER — OXYCODONE-ACETAMINOPHEN 5-325 MG PO TABS: ORAL_TABLET | ORAL | 0 refills | Status: DC

## 2024-02-01 ENCOUNTER — Encounter (HOSPITAL_COMMUNITY): Payer: MEDICAID

## 2024-02-06 ENCOUNTER — Telehealth: Payer: Self-pay

## 2024-02-06 ENCOUNTER — Telehealth: Payer: Self-pay | Admitting: Orthopaedic Surgery

## 2024-02-06 MED ORDER — OXYCODONE-ACETAMINOPHEN 5-325 MG PO TABS: ORAL_TABLET | ORAL | 0 refills | Status: DC

## 2024-02-06 NOTE — Addendum Note (Signed)
 Addended by: BRENNA NORLEEN ORN on: 02/06/2024 10:12 PM   Modules accepted: Orders

## 2024-02-06 NOTE — Telephone Encounter (Signed)
 Dr. Sanjuan Dame pt - pt lvm requesting a refill for Oxycodone 5-325 to be sent to [facility] in [place]

## 2024-02-06 NOTE — Telephone Encounter (Signed)
 LVM to schedule orthotic pick up

## 2024-02-07 ENCOUNTER — Telehealth: Payer: Self-pay | Admitting: Orthopaedic Surgery

## 2024-02-14 ENCOUNTER — Telehealth: Payer: Self-pay | Admitting: Orthopaedic Surgery

## 2024-02-14 MED ORDER — OXYCODONE-ACETAMINOPHEN 5-325 MG PO TABS: ORAL_TABLET | ORAL | 0 refills | Status: DC

## 2024-02-14 NOTE — Telephone Encounter (Signed)
 Dr. Sanjuan Dame pt - pt lvm requesting a refill for Oxycodone 5-325 to be sent to [facility] in [place]

## 2024-02-21 ENCOUNTER — Telehealth: Payer: Self-pay | Admitting: Orthopaedic Surgery

## 2024-02-21 MED ORDER — OXYCODONE-ACETAMINOPHEN 5-325 MG PO TABS
ORAL_TABLET | ORAL | 0 refills | Status: DC
Start: 2024-02-21 — End: 2024-02-27

## 2024-02-21 NOTE — Telephone Encounter (Signed)
 Dr. Sanjuan Dame pt - pt lvm requesting a refill for Oxycodone 5-325 to be sent to [facility]

## 2024-02-27 ENCOUNTER — Telehealth: Payer: Self-pay | Admitting: Orthopaedic Surgery

## 2024-02-27 NOTE — Telephone Encounter (Signed)
 Dr. Janae pt - spoke w/the pt, he is requesting a refill for Oxycodone  5-325 to be sent to [facility]

## 2024-02-28 MED ORDER — OXYCODONE-ACETAMINOPHEN 5-325 MG PO TABS: ORAL_TABLET | ORAL | 0 refills | Status: DC

## 2024-02-29 ENCOUNTER — Ambulatory Visit: Payer: MEDICAID | Admitting: Adult Health

## 2024-03-06 ENCOUNTER — Telehealth: Payer: Self-pay | Admitting: Orthopaedic Surgery

## 2024-03-06 MED ORDER — OXYCODONE-ACETAMINOPHEN 5-325 MG PO TABS: ORAL_TABLET | ORAL | 0 refills | Status: DC

## 2024-03-06 NOTE — Telephone Encounter (Signed)
 Dr. Sanjuan Dame pt - pt lvm requesting a refill for Oxycodone 5-325 to be sent to [facility] in [place]

## 2024-03-13 ENCOUNTER — Telehealth: Payer: Self-pay | Admitting: Orthopaedic Surgery

## 2024-03-13 MED ORDER — OXYCODONE-ACETAMINOPHEN 5-325 MG PO TABS
ORAL_TABLET | ORAL | 0 refills | Status: DC
Start: 2024-03-13 — End: 2024-03-20

## 2024-03-13 NOTE — Telephone Encounter (Signed)
 Dr. Sanjuan Dame pt - pt lvm requesting a refill for Oxycodone 5-325 to be sent to [facility] in [place]

## 2024-03-15 ENCOUNTER — Encounter: Payer: Self-pay | Admitting: Radiology

## 2024-03-20 ENCOUNTER — Telehealth: Payer: Self-pay | Admitting: Orthopaedic Surgery

## 2024-03-20 MED ORDER — OXYCODONE-ACETAMINOPHEN 5-325 MG PO TABS: ORAL_TABLET | ORAL | 0 refills | Status: DC

## 2024-03-20 NOTE — Telephone Encounter (Signed)
 Dr. Janae pt - spoke w/the pt, he is requesting a refill for Oxycodone  5-325 to be sent to [facility]

## 2024-03-27 ENCOUNTER — Telehealth: Payer: Self-pay | Admitting: Orthopaedic Surgery

## 2024-03-27 MED ORDER — OXYCODONE-ACETAMINOPHEN 5-325 MG PO TABS: ORAL_TABLET | ORAL | 0 refills | Status: DC

## 2024-03-27 NOTE — Telephone Encounter (Signed)
 Dr. Sanjuan Dame pt - pt lvm requesting a refill for Oxycodone 5-325 to be sent to [facility]

## 2024-04-02 ENCOUNTER — Telehealth: Payer: Self-pay | Admitting: Orthopaedic Surgery

## 2024-04-02 NOTE — Telephone Encounter (Signed)
 Dr. Janae pt - pt will be calling tomorrow for a refill for Oxycodone  5-325 to be sent to [facility]

## 2024-04-03 MED ORDER — OXYCODONE-ACETAMINOPHEN 5-325 MG PO TABS: ORAL_TABLET | ORAL | 0 refills | Status: DC

## 2024-04-10 ENCOUNTER — Telehealth: Payer: Self-pay | Admitting: Orthopaedic Surgery

## 2024-04-10 MED ORDER — OXYCODONE-ACETAMINOPHEN 5-325 MG PO TABS: ORAL_TABLET | ORAL | 0 refills | Status: DC

## 2024-04-10 NOTE — Telephone Encounter (Signed)
 Dr. Sanjuan Dame pt - pt lvm requesting a refill for Oxycodone 5-325 to be sent to [facility] in [place]

## 2024-04-11 ENCOUNTER — Telehealth: Payer: Self-pay | Admitting: Orthopaedic Surgery

## 2024-04-11 NOTE — Telephone Encounter (Signed)
 Dr. Janae pt - Payton Nurse Case Manager w/[employer] [phone] lvm stating the pt reached out to them wanting a MRI and he wasn't able to finish PT.  She wants to know if the MRI can be resubmitted for approval.

## 2024-04-12 NOTE — Telephone Encounter (Signed)
 I got online to try to get the PA and it shows he is inactive. I will check again later

## 2024-04-17 ENCOUNTER — Telehealth: Payer: Self-pay | Admitting: Orthopaedic Surgery

## 2024-04-17 MED ORDER — OXYCODONE-ACETAMINOPHEN 5-325 MG PO TABS: ORAL_TABLET | ORAL | 0 refills | Status: DC

## 2024-04-17 NOTE — Telephone Encounter (Signed)
 Dr. Janae pt - pt lvm after hours requesting a refill for Oxycodone  5-325 to be sent to [facility]

## 2024-04-23 NOTE — Telephone Encounter (Signed)
 I called payton case manager to see if able to help me with this pt. I went online to [facility] to see if can resubmit for MRI and it stated pt was not active and then I called the number and they told me could not find pt. Pending call back from Payton

## 2024-04-24 ENCOUNTER — Telehealth: Payer: Self-pay | Admitting: Orthopaedic Surgery

## 2024-04-24 MED ORDER — OXYCODONE-ACETAMINOPHEN 5-325 MG PO TABS: ORAL_TABLET | ORAL | 0 refills | Status: DC

## 2024-04-24 NOTE — Telephone Encounter (Signed)
 Dr. Brenna pt - spoke w/the pt, he is requesting a refill for Oxycodone  5-325 to be sent to [facility]

## 2024-05-07 ENCOUNTER — Ambulatory Visit: Payer: MEDICAID | Admitting: Orthopedic Surgery

## 2024-05-07 ENCOUNTER — Encounter: Payer: Self-pay | Admitting: Orthopedic Surgery

## 2024-05-07 VITALS — BP 154/103 | HR 91 | Ht 70.0 in | Wt 250.0 lb

## 2024-05-07 DIAGNOSIS — F411 Generalized anxiety disorder: Secondary | ICD-10-CM | POA: Insufficient documentation

## 2024-05-07 DIAGNOSIS — M5441 Lumbago with sciatica, right side: Secondary | ICD-10-CM

## 2024-05-07 DIAGNOSIS — G8929 Other chronic pain: Secondary | ICD-10-CM

## 2024-05-07 DIAGNOSIS — I77819 Aortic ectasia, unspecified site: Secondary | ICD-10-CM | POA: Insufficient documentation

## 2024-05-07 DIAGNOSIS — F39 Unspecified mood [affective] disorder: Secondary | ICD-10-CM | POA: Insufficient documentation

## 2024-05-07 DIAGNOSIS — I10 Essential (primary) hypertension: Secondary | ICD-10-CM | POA: Insufficient documentation

## 2024-05-07 DIAGNOSIS — M5442 Lumbago with sciatica, left side: Secondary | ICD-10-CM

## 2024-05-07 DIAGNOSIS — E785 Hyperlipidemia, unspecified: Secondary | ICD-10-CM | POA: Insufficient documentation

## 2024-05-07 MED ORDER — TRAMADOL HCL 50 MG PO TABS: 50.0000 mg | ORAL_TABLET | Freq: Four times a day (QID) | ORAL | 0 refills | Status: DC | PRN

## 2024-05-07 NOTE — Patient Instructions (Signed)
 Please contact the office if you get more information about the MRI for your back

## 2024-05-08 NOTE — Progress Notes (Signed)
 Orthopaedic Clinic Return  Assessment: Shawn Pittman is a 59 y.o. male with the following: Chronic lower back pain with sciatica  Plan: Chronic low back pain.  Pain is radiating.  He will benefit from an MRI.  Insurance has not authorized an MRI.  He is going to contact [employer].  Provided tramadol , no more hydrocodone  oxycodone .  Can try injections in his back   Meds ordered this encounter  Medications   traMADol  (ULTRAM ) 50 MG tablet    Sig: Take 1 tablet (50 mg total) by mouth every 6 (six) hours as needed.    Dispense:  30 tablet    Refill:  0    Body mass index is 35.87 kg/m.  Follow-up: Return if symptoms worsen or fail to improve.   Subjective:  Chief Complaint  Patient presents with   Pain    LBP, knees, ankle    History of Present Illness: Shawn Pittman is a 58 y.o. male who returns to clinic for repeat evaluation of back pain.  He has chronic low back pain.  It has been getting worse.  He has radiating pains into his legs.  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  He has been trying to get an MRI.  This has not been authorized.  He has worked with PT.  He has been followed by Dr.  Brenna.  He has been taking oxycodone .   Review of Systems: No fevers or chills No numbness or tingling No chest pain No shortness of breath No bowel or bladder dysfunction No GI distress No headaches   Objective: BP (!) 154/103   Pulse 91   Ht 5' 10 (1.778 m)   Wt 250 lb (113.4 kg)   BMI 35.87 kg/m   Physical Exam:  Tenderness in the lower back.  Positive straight leg raise.  Sensation intact to both feet.   IMAGING: I personally ordered and reviewed the following images:   Oneil DELENA Horde, MD 05/08/2024 11:17 PM

## 2024-05-14 ENCOUNTER — Telehealth: Payer: Self-pay | Admitting: Orthopedic Surgery

## 2024-05-14 DIAGNOSIS — G8929 Other chronic pain: Secondary | ICD-10-CM

## 2024-05-14 DIAGNOSIS — G894 Chronic pain syndrome: Secondary | ICD-10-CM

## 2024-05-14 NOTE — Telephone Encounter (Signed)
 Dr. Onesimo pt - spoke w/Payton w/[employer] [phone], she is requesting that the pt's MRI order be resent and see if ins will cover now.  She stated that he has done on the necessary things, but didn't finish PT, but feels that wont be an issue.

## 2024-05-15 ENCOUNTER — Telehealth: Payer: Self-pay | Admitting: Orthopedic Surgery

## 2024-05-15 ENCOUNTER — Telehealth: Payer: Self-pay

## 2024-05-15 NOTE — Telephone Encounter (Signed)
 OK to try to reorder?

## 2024-05-15 NOTE — Telephone Encounter (Signed)
 Called patient Dr Tobie schedule is full.

## 2024-05-15 NOTE — Telephone Encounter (Signed)
 Dr. Onesimo pt - pt lvm stating he need a refill for Tamadol 50mg , he stated he needs his meds and the pharmacy couldn't do it for some reason.  [phone]

## 2024-05-15 NOTE — Telephone Encounter (Signed)
 Please advise, patient would like to know accept him as a new patient since you see his wife. Copied from CRM [id number]. Topic: Appointments - Appointment Scheduling >> May 15, 2024 10:13 AM Franky GRADE wrote: Patient/patient representative is calling to schedule an appointment. Refer to attachments for appointment information. Patient is calling because his primary care provider moved and his wife Shawn Pittman who is a patient of Dr.Patel recommended him to see Dr.Patel, he would like to know if Dr.Patel would make an exception and see him as a new patient.

## 2024-05-16 NOTE — Addendum Note (Signed)
 Addended by: VICENTA EMMIE HERO on: 05/16/2024 09:56 AM   Modules accepted: Orders

## 2024-05-17 MED ORDER — TRAMADOL HCL 50 MG PO TABS: 50.0000 mg | ORAL_TABLET | Freq: Four times a day (QID) | ORAL | 0 refills | Status: DC | PRN

## 2024-05-17 NOTE — Telephone Encounter (Signed)
 Last rx says it was an eight day supply. Pt is trying to get a refill.

## 2024-05-23 NOTE — Telephone Encounter (Signed)
 I called and spoke with payton and she stated [employer] is needing documentation from when the symptoms started to now, and if pt had PT, home exercises, etc. I will try to call the pt insurance to see if can verbally give them what they need.

## 2024-05-23 NOTE — Telephone Encounter (Signed)
 Dr. Onesimo pt - spoke w/Payton w/[employer] [facility] [phone], she stated that [facility] faxed something indicating what medical records they need.  She stated this has to be done by tomorrow in order for this to remain open.

## 2024-05-27 ENCOUNTER — Other Ambulatory Visit: Payer: Self-pay | Admitting: Orthopedic Surgery

## 2024-05-27 ENCOUNTER — Telehealth: Payer: Self-pay | Admitting: Orthopedic Surgery

## 2024-05-27 ENCOUNTER — Encounter: Payer: Self-pay | Admitting: Radiology

## 2024-05-27 NOTE — Telephone Encounter (Signed)
 Dr. Onesimo pt - pt lvm requesting a refill for Tramadol  50mg , 28 tablets, every 6 hours PRN to be sent to [facility]

## 2024-05-27 NOTE — Telephone Encounter (Signed)
 Duplicate request, was sent earlier

## 2024-05-30 ENCOUNTER — Telehealth: Payer: Self-pay

## 2024-05-30 NOTE — Telephone Encounter (Signed)
 Patient called and left message stating that his insurance has approved the MRI.  Please call and advise [phone]  His insurance left message also saying the it was approved and you could see it in [employer].  Any questions please call [phone]

## 2024-06-03 ENCOUNTER — Other Ambulatory Visit: Payer: Self-pay | Admitting: Orthopedic Surgery

## 2024-06-04 NOTE — Telephone Encounter (Signed)
 Called and gave pt # to Central scheduling to get MRI scheduled.

## 2024-06-08 ENCOUNTER — Ambulatory Visit (HOSPITAL_COMMUNITY)
Admission: RE | Admit: 2024-06-08 | Discharge: 2024-06-08 | Disposition: A | Discharge status: Home / Self Care | Payer: MEDICAID | Source: Ambulatory Visit | Attending: Orthopedic Surgery | Admitting: Orthopedic Surgery

## 2024-06-08 DIAGNOSIS — M5441 Lumbago with sciatica, right side: Secondary | ICD-10-CM | POA: Insufficient documentation

## 2024-06-08 DIAGNOSIS — G894 Chronic pain syndrome: Secondary | ICD-10-CM | POA: Insufficient documentation

## 2024-06-08 DIAGNOSIS — G8929 Other chronic pain: Secondary | ICD-10-CM | POA: Insufficient documentation

## 2024-06-08 DIAGNOSIS — M5442 Lumbago with sciatica, left side: Secondary | ICD-10-CM | POA: Diagnosis present

## 2024-06-10 ENCOUNTER — Other Ambulatory Visit: Payer: Self-pay | Admitting: Orthopedic Surgery

## 2024-06-17 ENCOUNTER — Other Ambulatory Visit: Payer: Self-pay | Admitting: Orthopedic Surgery

## 2024-06-19 ENCOUNTER — Encounter: Payer: Self-pay | Admitting: Orthopedic Surgery

## 2024-06-19 ENCOUNTER — Ambulatory Visit: Payer: MEDICAID | Admitting: Orthopedic Surgery

## 2024-06-19 DIAGNOSIS — M5442 Lumbago with sciatica, left side: Secondary | ICD-10-CM | POA: Diagnosis not present

## 2024-06-19 DIAGNOSIS — G8929 Other chronic pain: Secondary | ICD-10-CM

## 2024-06-19 DIAGNOSIS — M5441 Lumbago with sciatica, right side: Secondary | ICD-10-CM | POA: Diagnosis not present

## 2024-06-19 DIAGNOSIS — M75111 Incomplete rotator cuff tear or rupture of right shoulder, not specified as traumatic: Secondary | ICD-10-CM | POA: Diagnosis not present

## 2024-06-19 MED ORDER — METHYLPREDNISOLONE ACETATE 40 MG/ML IJ SUSP
40.0000 mg | Freq: Once | INTRAMUSCULAR | Status: AC
  Administered 2024-06-19: 40 mg via INTRA_ARTICULAR

## 2024-06-19 NOTE — Patient Instructions (Signed)
 Instructions Following Joint Injections  In clinic today, you received an injection in one of your joints (sometimes more than one).  Occasionally, you can have some pain at the injection site, this is normal.  You can place ice at the injection site, or take over-the-counter medications such as Tylenol  (acetaminophen ) or Advil (ibuprofen).  Please follow all directions listed on the bottle.  If your joint (knee or shoulder) becomes swollen, red or very painful, please contact the clinic for additional assistance.   Two medications were injected, including lidocaine  and a steroid (often referred to as cortisone).  Lidocaine  is effective almost immediately but wears off quickly.  However, the steroid can take a few days to improve your symptoms.  In some cases, it can make your pain worse for a couple of days.  Do not be concerned if this happens as it is common.  You can apply ice or take some over-the-counter medications as needed.   Injections in the same joint cannot be repeated for 3 months.  This helps to limit the risk of an infection in the joint.  If you were to develop an infection in your joint, the best treatment option would be surgery.     Recommend referral to pain management  Recommend referral to Dr. Eldonna to consider injections

## 2024-06-21 NOTE — Progress Notes (Signed)
 Orthopaedic Clinic Return  Assessment: MARTIN BELLING is a 58 y.o. male with the following: Chronic lower back pain with sciatica Right shoulder pain; partial-thickness tearing.   Plan: Mr. Francella continues to have issues with his lower back.  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  He states the tramadol  that has been prescribed is not providing sustained relief.  I have advised him that we will place a referral for pain management.  He is going to contact the clinic in [place] to see if this is a possibility.  Otherwise, we can consider a referral for back injections, or a referral to a spine specialist to discuss potential treatment options.  He is also complaining of right shoulder pain.  I previously injected the shoulder, which does provide some relief of his symptoms.  The right shoulder was injected in clinic today.  There were no issues.  Ultimately, he does a lot of lifting at work, which is likely contributing to his ongoing symptoms.  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  No surgical intervention is warranted.  If he continues to have issues with the shoulder, I am happy to provide additional options.  Procedure note injection - Right shoulder    Verbal consent was obtained to inject the right shoulder, subacromial space Timeout was completed to confirm the site of injection.   The skin was prepped with alcohol and ethyl chloride was sprayed at the injection site.  A 21-gauge needle was used to inject 40 mg of Depo-Medrol  and 1% lidocaine  (4 cc) into the subacromial space of the right shoulder using a posterolateral approach.  There were no complications.  A sterile bandage was applied.     Follow-up: Return if symptoms worsen or fail to improve.   Subjective:  Chief Complaint  Patient presents with   Results    Review MRI  lumbar spine     History of Present Illness: RON BESKE is a 58 y.o. male who returns to clinic for repeat evaluation of back pain.  He has chronic low back pain.  He continues to have a lot of pain in his back.  It is radiating into both legs.  He is having difficulty completing his tasks at work.  He states tramadol  is not sufficient to control his pain.  In addition, he notes that the right shoulder is starting to bother him more.  I previously injected his right shoulder.  He has obtained an MRI of his lumbar spine, and is here to discuss the findings.   Review of Systems: No fevers or chills No numbness or tingling No chest pain No shortness of breath No bowel or bladder dysfunction No GI distress No headaches   Objective: There were no vitals taken for this visit.  Physical Exam:  Tenderness in the lower back.  Positive straight leg raise.  Sensation intact to both feet.   Right shoulder without deformity.  He has good range of motion, but notes pain.  Positive Jobes.  5/5 supraspinatus and infraspinatus strength.  Negative belly press.    IMAGING: I personally ordered and reviewed the following images:   IMPRESSION: 1. Compared with previous MRI from 05/17/2018, no acute findings or clear explanation for the patient's symptoms. 2. Previously demonstrated small left paracentral disc protrusion at L3-4 has largely involuted. There is mildly increased moderate right foraminal narrowing at this  level with possible right L3 nerve root impingement. 3. 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 4. Stable mild to moderate foraminal narrowing bilaterally at L5-S1.     Oneil DELENA Horde, MD 06/21/2024 11:52 AM

## 2024-06-24 ENCOUNTER — Other Ambulatory Visit: Payer: Self-pay | Admitting: Orthopedic Surgery

## 2024-07-02 ENCOUNTER — Other Ambulatory Visit: Payer: Self-pay | Admitting: Orthopedic Surgery

## 2024-07-08 ENCOUNTER — Other Ambulatory Visit: Payer: Self-pay | Admitting: Orthopedic Surgery

## 2024-07-16 ENCOUNTER — Other Ambulatory Visit: Payer: Self-pay | Admitting: Orthopedic Surgery

## 2024-07-22 ENCOUNTER — Other Ambulatory Visit: Payer: Self-pay | Admitting: Orthopedic Surgery

## 2024-07-30 ENCOUNTER — Other Ambulatory Visit: Payer: Self-pay | Admitting: Orthopedic Surgery

## 2024-08-06 ENCOUNTER — Other Ambulatory Visit: Payer: Self-pay | Admitting: Orthopedic Surgery

## 2024-08-12 ENCOUNTER — Other Ambulatory Visit: Payer: Self-pay | Admitting: Orthopedic Surgery

## 2024-08-16 ENCOUNTER — Other Ambulatory Visit: Payer: Self-pay | Admitting: Orthopedic Surgery
